# Patient Record
Sex: Male | Born: 1955 | Race: White | Hispanic: No | Smoking: Never smoker
Health system: Southern US, Community
[De-identification: ages and names within clinical notes are randomized; demographics above are authoritative.]

## PROBLEM LIST (undated history)

## (undated) DIAGNOSIS — Z9289 Personal history of other medical treatment: Secondary | ICD-10-CM

## (undated) DIAGNOSIS — K449 Diaphragmatic hernia without obstruction or gangrene: Secondary | ICD-10-CM

## (undated) DIAGNOSIS — K5730 Diverticulosis of large intestine without perforation or abscess without bleeding: Secondary | ICD-10-CM

## (undated) DIAGNOSIS — Z125 Encounter for screening for malignant neoplasm of prostate: Principal | ICD-10-CM

## (undated) DIAGNOSIS — M19042 Primary osteoarthritis, left hand: Secondary | ICD-10-CM

## (undated) DIAGNOSIS — K219 Gastro-esophageal reflux disease without esophagitis: Secondary | ICD-10-CM

## (undated) DIAGNOSIS — R0602 Shortness of breath: Secondary | ICD-10-CM

## (undated) DIAGNOSIS — I10 Essential (primary) hypertension: Secondary | ICD-10-CM

## (undated) DIAGNOSIS — R011 Cardiac murmur, unspecified: Secondary | ICD-10-CM

## (undated) DIAGNOSIS — G4733 Obstructive sleep apnea (adult) (pediatric): Secondary | ICD-10-CM

## (undated) DIAGNOSIS — C642 Malignant neoplasm of left kidney, except renal pelvis: Secondary | ICD-10-CM

## (undated) DIAGNOSIS — N281 Cyst of kidney, acquired: Secondary | ICD-10-CM

## (undated) DIAGNOSIS — M19041 Primary osteoarthritis, right hand: Secondary | ICD-10-CM

## (undated) DIAGNOSIS — D62 Acute posthemorrhagic anemia: Secondary | ICD-10-CM

## (undated) HISTORY — DX: Diaphragmatic hernia without obstruction or gangrene: K44.9

## (undated) HISTORY — PX: HERNIA REPAIR: SHX51

## (undated) HISTORY — DX: Cyst of kidney, acquired: N28.1

## (undated) HISTORY — DX: Acute posthemorrhagic anemia: D62

## (undated) HISTORY — DX: Shortness of breath: R06.02

## (undated) HISTORY — DX: Diverticulosis of large intestine without perforation or abscess without bleeding: K57.30

## (undated) HISTORY — DX: Primary osteoarthritis, left hand: M19.042

## (undated) HISTORY — DX: Encounter for screening for malignant neoplasm of prostate: Z12.5

## (undated) HISTORY — DX: Primary osteoarthritis, right hand: M19.041

## (undated) HISTORY — DX: Essential (primary) hypertension: I10

## (undated) HISTORY — DX: Obstructive sleep apnea (adult) (pediatric): G47.33

---

## 1991-05-13 HISTORY — PX: BOWEL RESECTION: SHX1257

## 2013-02-02 ENCOUNTER — Ambulatory Visit: Payer: Self-pay | Admitting: Unknown Physician Specialty

## 2013-02-04 LAB — PATHOLOGY REPORT

## 2014-03-23 DIAGNOSIS — E7800 Pure hypercholesterolemia, unspecified: Secondary | ICD-10-CM | POA: Insufficient documentation

## 2014-09-25 DIAGNOSIS — G8929 Other chronic pain: Secondary | ICD-10-CM | POA: Insufficient documentation

## 2014-09-25 DIAGNOSIS — M79646 Pain in unspecified finger(s): Secondary | ICD-10-CM

## 2015-04-03 DIAGNOSIS — M19041 Primary osteoarthritis, right hand: Secondary | ICD-10-CM

## 2015-04-03 DIAGNOSIS — M19042 Primary osteoarthritis, left hand: Secondary | ICD-10-CM

## 2015-04-03 HISTORY — DX: Primary osteoarthritis, right hand: M19.041

## 2016-03-09 DIAGNOSIS — K219 Gastro-esophageal reflux disease without esophagitis: Secondary | ICD-10-CM | POA: Insufficient documentation

## 2016-03-09 DIAGNOSIS — I10 Essential (primary) hypertension: Secondary | ICD-10-CM

## 2016-03-09 HISTORY — DX: Essential (primary) hypertension: I10

## 2016-03-11 DIAGNOSIS — D62 Acute posthemorrhagic anemia: Secondary | ICD-10-CM

## 2016-03-11 HISTORY — DX: Acute posthemorrhagic anemia: D62

## 2016-03-12 DIAGNOSIS — K449 Diaphragmatic hernia without obstruction or gangrene: Secondary | ICD-10-CM

## 2016-03-12 DIAGNOSIS — C642 Malignant neoplasm of left kidney, except renal pelvis: Secondary | ICD-10-CM

## 2016-03-12 DIAGNOSIS — K5730 Diverticulosis of large intestine without perforation or abscess without bleeding: Secondary | ICD-10-CM

## 2016-03-12 HISTORY — DX: Malignant neoplasm of left kidney, except renal pelvis: C64.2

## 2016-03-12 HISTORY — DX: Diaphragmatic hernia without obstruction or gangrene: K44.9

## 2016-03-12 HISTORY — DX: Diverticulosis of large intestine without perforation or abscess without bleeding: K57.30

## 2016-03-18 ENCOUNTER — Other Ambulatory Visit: Payer: Self-pay | Admitting: Family Medicine

## 2016-03-18 DIAGNOSIS — N281 Cyst of kidney, acquired: Secondary | ICD-10-CM

## 2016-03-21 ENCOUNTER — Ambulatory Visit: Payer: Self-pay

## 2016-03-24 DIAGNOSIS — G4733 Obstructive sleep apnea (adult) (pediatric): Secondary | ICD-10-CM

## 2016-03-24 DIAGNOSIS — R0602 Shortness of breath: Secondary | ICD-10-CM

## 2016-03-24 HISTORY — DX: Obstructive sleep apnea (adult) (pediatric): G47.33

## 2016-03-24 HISTORY — DX: Shortness of breath: R06.02

## 2016-03-26 ENCOUNTER — Ambulatory Visit
Admission: RE | Admit: 2016-03-26 | Discharge: 2016-03-26 | Disposition: A | Discharge status: Home / Self Care | Payer: BLUE CROSS/BLUE SHIELD | Source: Ambulatory Visit | Attending: Family Medicine | Admitting: Family Medicine

## 2016-03-26 DIAGNOSIS — N2889 Other specified disorders of kidney and ureter: Secondary | ICD-10-CM | POA: Insufficient documentation

## 2016-03-26 DIAGNOSIS — N281 Cyst of kidney, acquired: Secondary | ICD-10-CM | POA: Diagnosis not present

## 2016-04-08 ENCOUNTER — Ambulatory Visit: Payer: BLUE CROSS/BLUE SHIELD | Admitting: Urology

## 2016-04-08 ENCOUNTER — Encounter: Payer: Self-pay | Admitting: Urology

## 2016-04-08 VITALS — BP 130/79 | HR 73 | Ht 69.0 in | Wt 190.0 lb

## 2016-04-08 DIAGNOSIS — Z125 Encounter for screening for malignant neoplasm of prostate: Secondary | ICD-10-CM

## 2016-04-08 DIAGNOSIS — Z85528 Personal history of other malignant neoplasm of kidney: Secondary | ICD-10-CM | POA: Insufficient documentation

## 2016-04-08 DIAGNOSIS — C642 Malignant neoplasm of left kidney, except renal pelvis: Secondary | ICD-10-CM | POA: Diagnosis not present

## 2016-04-08 DIAGNOSIS — N281 Cyst of kidney, acquired: Secondary | ICD-10-CM

## 2016-04-08 HISTORY — DX: Encounter for screening for malignant neoplasm of prostate: Z12.5

## 2016-04-08 HISTORY — DX: Cyst of kidney, acquired: N28.1

## 2016-04-08 NOTE — Progress Notes (Signed)
   04/08/2016 7:01 AM   Isaac Peterson 05/14/1955 [id number]  Referring provider: Sherrin Daisy, MD Isaac Peterson, [place] [id number]  CC: New patient - left renal mass, right renal cyst, prostate screening  HPI:   1. Renal malignant neoplasm, left  - 2cm mid lateral heterogenous mass incidental by abd CT at [facility] 2017 at time of GI bleed dedicated US 03/2016 confirms solid. No dedicated axial imaging as of yet.   2. Renal cyst, acquired, right - non-complex <2cm right simple cyst w/o mass effect by [place] 03/2016.  3. Prostate cancer screening  - No FHX prostate cancer 03/2016 DRE - 35gm smoth / PSA (today / pending)  PMH sig for GI Bleed, sigmoid resection for diverticulitis (infraumbilical scar only), bilateral inguinal hernia repair as child. No CV disesae / blood thinners.   Today "Isaac Peterson" is seen as new patient for above.    PMH: No past medical history on file.  Surgical History: No past surgical history on file.  Home Medications:    Medication List    as of 04/08/2016  7:01 AM   You have not been prescribed any medications.     Allergies: Allergies not on file  Family History: No family history on file.  Social History:  has no tobacco, alcohol, and drug history on file.   Review of Systems  Gastrointestinal (upper)  : Negative for upper GI symptoms  Gastrointestinal (lower) : Negative for lower GI symptoms  Constitutional : Negative for symptoms  Skin: Negative for skin symptoms  Eyes: Negative for eye symptoms  Ear/Nose/Throat : Negative for Ear/Nose/Throat symptoms  Hematologic/Lymphatic: Negative for Hematologic/Lymphatic symptoms  Cardiovascular : Negative for cardiovascular symptoms  Respiratory : Negative for respiratory symptoms  Endocrine: Negative for endocrine symptoms  Musculoskeletal: Negative for musculoskeletal symptoms  Neurological: Negative for neurological symptoms  Psychologic: Negative for psychiatric  symptoms  Physical Exam: There were no vitals taken for this visit.  Constitutional:  Alert and oriented, No acute distress. HEENT: Isaac Peterson AT, moist mucus membranes.  Trachea midline, no masses. Cardiovascular: No clubbing, cyanosis, or edema. Respiratory: Normal respiratory effort, no increased work of breathing. GI: Abdomen is soft, nontender, nondistended, no abdominal masses GU: No CVA tenderness. Phallus sraight. No testes masses. DRE 35gm smooth.  Skin: No rashes, bruises or suspicious lesions. Lymph: No cervical or inguinal adenopathy. Neurologic: Grossly intact, no focal deficits, moving all 4 extremities. Psychiatric: Normal mood and affect.  Laboratory Data: No results found for: WBC, HGB, HCT, MCV, PLT  No results found for: CREATININE  No results found for: PSA  Urinalysis No results found for: COLORURINE, APPEARANCEUR, LABSPEC, PHURINE, GLUCOSEU, HGBUR, BILIRUBINUR, KETONESUR, PROTEINUR, UROBILINOGEN, NITRITE, LEUKOCYTESUR  Pertinent Imaging: as per HPI  Assessment & Plan:    1. Renal malignant neoplasm, left  - this is high concerning for localized stage 1 renal cell carcinoma. Rec renal MRI with and without contrast asap and RTC with myself or Dr. Erlene Peterson to discuss possible partial nephrectomy v. Surveillance.   2. Renal cyst, acquired, right - unlike solid mass above, this is low risk and has no mass effect. Observe.  3. Prostate cancer screening - DRE today normal PSA today to complete eval.  RTC with renal MRI.   No Follow-up on file.  Isaac Peterson, [facility] [address] [place], [place] [address] [phone]

## 2016-04-08 NOTE — Addendum Note (Signed)
Addended by: Tommy Rainwater on: 04/08/2016 11:33 AM   Modules accepted: Orders

## 2016-04-09 LAB — BASIC METABOLIC PANEL
BUN/Creatinine Ratio: 11 (ref 10–24)
BUN: 9 mg/dL (ref 8–27)
CO2: 24 mmol/L (ref 18–29)
CREATININE: 0.82 mg/dL (ref 0.76–1.27)
Calcium: 9.9 mg/dL (ref 8.6–10.2)
Chloride: 99 mmol/L (ref 96–106)
GFR, EST AFRICAN AMERICAN: 111 mL/min/{1.73_m2} (ref >59)
GFR, EST NON AFRICAN AMERICAN: 96 mL/min/{1.73_m2} (ref >59)
Glucose: 99 mg/dL (ref 65–99)
Potassium: 4.4 mmol/L (ref 3.5–5.2)
SODIUM: 139 mmol/L (ref 134–144)

## 2016-04-09 LAB — PSA: Prostate Specific Ag, Serum: 2.5 ng/mL (ref 0.0–4.0)

## 2016-05-02 ENCOUNTER — Ambulatory Visit
Admission: RE | Admit: 2016-05-02 | Discharge: 2016-05-02 | Disposition: A | Discharge status: Home / Self Care | Payer: BLUE CROSS/BLUE SHIELD | Source: Ambulatory Visit | Attending: Urology | Admitting: Urology

## 2016-05-02 DIAGNOSIS — C642 Malignant neoplasm of left kidney, except renal pelvis: Secondary | ICD-10-CM | POA: Insufficient documentation

## 2016-05-02 DIAGNOSIS — N281 Cyst of kidney, acquired: Secondary | ICD-10-CM | POA: Diagnosis present

## 2016-05-02 DIAGNOSIS — M899 Disorder of bone, unspecified: Secondary | ICD-10-CM | POA: Insufficient documentation

## 2016-05-02 DIAGNOSIS — K5710 Diverticulosis of small intestine without perforation or abscess without bleeding: Secondary | ICD-10-CM | POA: Insufficient documentation

## 2016-05-02 DIAGNOSIS — K449 Diaphragmatic hernia without obstruction or gangrene: Secondary | ICD-10-CM | POA: Insufficient documentation

## 2016-05-02 DIAGNOSIS — N2889 Other specified disorders of kidney and ureter: Secondary | ICD-10-CM | POA: Diagnosis present

## 2016-05-02 MED ORDER — GADOBENATE DIMEGLUMINE 529 MG/ML IV SOLN
18.0000 mL | Freq: Once | INTRAVENOUS | Status: AC | PRN
  Administered 2016-05-02: 18 mL via INTRAVENOUS

## 2016-05-07 ENCOUNTER — Telehealth: Payer: Self-pay

## 2016-05-07 NOTE — Telephone Encounter (Signed)
Isaac Frock, MD  Isaac Box, LPN        Please mail MRI report with message below:   Isaac Peterson,   Renal MRI confirms likely cystic renal tumor. The radiologist also notes small area on spine that is likely benign bone growth known as a hemangioma.   It appears that you are now seeing a Urologist at [facility] and have opted for surveillance. That is reasonable.   Both of these areas can be surveilled on future imaging through them if you prefer.   Isaac Colla MD      Spoke with pt in reference to MRI results and surveillance. Pt voiced understanding.

## 2016-05-19 ENCOUNTER — Ambulatory Visit: Payer: BLUE CROSS/BLUE SHIELD | Admitting: Urology

## 2016-05-19 ENCOUNTER — Encounter: Payer: Self-pay | Admitting: Urology

## 2016-05-19 VITALS — BP 102/68 | HR 76 | Ht 69.0 in | Wt 194.6 lb

## 2016-05-19 DIAGNOSIS — C642 Malignant neoplasm of left kidney, except renal pelvis: Secondary | ICD-10-CM

## 2016-05-19 DIAGNOSIS — N281 Cyst of kidney, acquired: Secondary | ICD-10-CM

## 2016-05-19 DIAGNOSIS — Z125 Encounter for screening for malignant neoplasm of prostate: Secondary | ICD-10-CM

## 2016-05-19 NOTE — Progress Notes (Signed)
05/19/2016 6:39 AM   Isaac Peterson 1956-01-08 [id number]  Referring provider: Sherrin Daisy, MD [place], [place] [id number]  CC: Follow Up - left renal mass, right renal cyst, prostate screening  HPI:   1. Renal malignant neoplasm, left  - 2cm mid lateral heterogenous mass incidental by abd CT at [facility] 2017 at time of GI bleed. Dedicated MRI shows 50% exophytic posterior mid cystic mass 2.4cm with some enhancement c/w likely small clinically localized cystic renal cell. 1 artery / 1 vein renovascular anatomy. Open retroperitoneal window to kidney (no retrorenal colon).   2. Renal cyst, acquired, right - non-complex <2cm right simple cyst w/o mass effect by [place] 03/2016. Confirmed non-comples by MRI 05/2016.   3. Prostate cancer screening  - No FHX prostate cancer 03/2016 DRE - 35gm smoth / PSA 2.5  PMH sig for GI Bleed, sigmoid resection for diverticulitis (infraumbilical scar only), bilateral inguinal hernia repair as child. No CV disesae / blood thinners.   Today "Isaac Peterson" is seen in f/u above after MRI which confirms left small renal mass concerning for localized kidney cancer. Small T12 hemagioma also noted (seen / confirmed on prior referring CT)   PMH: Past Medical History:  Diagnosis Date  . Acute blood loss anemia 03/11/2016  . Arthritis of both hands 04/03/2015  . Diverticulosis of colon 03/12/2016  . Hypertension 03/09/2016  . Large hiatal hernia 03/12/2016  . OSA (obstructive sleep apnea) 03/24/2016  . Prostate cancer screening 04/08/2016  . Renal cyst, acquired, right 04/08/2016  . SOBOE (shortness of breath on exertion) 03/24/2016    Surgical History: Past Surgical History:  Procedure Laterality Date  . BOWEL RESECTION  1993  . [place]   x3    Home Medications:  Allergies as of 05/19/2016   No Known Allergies     Medication List       Accurate as of 05/19/16  6:39 AM. Always use your most recent med list.            ferrous sulfate 325 (65 FE) MG EC tablet Take 325 mg by mouth 3 (three) times daily with meals.   lisinopril-hydrochlorothiazide 20-25 MG tablet Commonly known as:  PRINZIDE,ZESTORETIC TAKE ONE TABLET BY MOUTH ONCE DAILY   MULTI-VITAMINS Tabs Take by mouth.   pravastatin 40 MG tablet Commonly known as:  PRAVACHOL TAKE ONE TABLET BY MOUTH ONCE DAILY   RA VITAMIN B-12 TR 1000 MCG Tbcr Generic drug:  Cyanocobalamin Take by mouth.       Allergies: No Known Allergies  Family History: Family History  Problem Relation Age of Onset  . Prostate cancer Neg Hx   . Kidney cancer Neg Hx     Social History:  reports that he has never smoked. He has never used smokeless tobacco. He reports that he does not drink alcohol or use drugs.   Review of Systems  Gastrointestinal (upper)  : Negative for upper GI symptoms  Gastrointestinal (lower) : Negative for lower GI symptoms  Constitutional : Negative for symptoms  Skin: Negative for skin symptoms  Eyes: Negative for eye symptoms  Ear/Nose/Throat : Negative for Ear/Nose/Throat symptoms  Hematologic/Lymphatic: Negative for Hematologic/Lymphatic symptoms  Cardiovascular : Negative for cardiovascular symptoms  Respiratory : Negative for respiratory symptoms  Endocrine: Negative for endocrine symptoms  Musculoskeletal: Negative for musculoskeletal symptoms  Neurological: Negative for neurological symptoms  Psychologic: Negative for psychiatric symptoms  Physical Exam: There were no vitals taken for this visit.  Constitutional:  Alert and oriented, No acute distress. HEENT: [place] AT, moist mucus membranes.  Trachea midline, no masses. Cardiovascular: No clubbing, cyanosis, or edema. Respiratory: Normal respiratory effort, no increased work of breathing. GI: Abdomen is soft, nontender, nondistended, no abdominal masses GU: No CVA tenderness.  Skin: No rashes, bruises or suspicious lesions. Lymph: No cervical or  inguinal adenopathy. Neurologic: Grossly intact, no focal deficits, moving all 4 extremities. Psychiatric: Normal mood and affect.  Laboratory Data: No results found for: WBC, HGB, HCT, MCV, PLT  Lab Results  Component Value Date   CREATININE 0.82 04/08/2016    No results found for: PSA  Urinalysis No results found for: COLORURINE, APPEARANCEUR, LABSPEC, PHURINE, GLUCOSEU, HGBUR, BILIRUBINUR, KETONESUR, PROTEINUR, UROBILINOGEN, NITRITE, LEUKOCYTESUR  Pertinent Imaging: as per HPI  Assessment & Plan:    1. Renal malignant neoplasm, left  - likely small cystic renal cell. Natural history (some indolent, some progressive) and management options including surveillance (consider therapy if clearly progresive), ablation, partial nephrectomy (trans or retroperitoneal) discussed in order of increasing agressiveness but also short term treatment related risk.  He opts for surveillance at this point with very low threshold to partial nephrectomy, I agree completely.   2. Renal cyst, acquired, right - unlike solid mass above, this is low risk and has no mass effect. Observe.  3. Prostate cancer screening - up to date on screening this year, continue until age 34 or so as average risk.   RTC 47mos with BMP, CT.   Alexis Frock, [facility] [address], [place], [place] [address] [phone]

## 2016-05-22 ENCOUNTER — Other Ambulatory Visit: Payer: Self-pay | Admitting: Urology

## 2016-06-26 NOTE — Patient Instructions (Signed)
Isaac Peterson  06/26/2016   Your procedure is scheduled on: 07/02/2016    Report to [facility]  Entrance take [facility]  elevators to 3rd floor to  [place] at    1100 AM.  Call this number if you have problems the morning of surgery [phone]   Remember: ONLY 1 PERSON MAY GO WITH YOU TO SHORT STAY TO GET  READY MORNING OF [name]  Do not eat food or drink liquids :After Midnight.             Clear liquid diet the day before surgery.               Bottle of Magnesium citrate the nite before surgery.      Take these medicines the morning of surgery with A SIP OF WATER: none                                 You may not have any metal on your body including hair pins and              piercings  Do not wear jewelry,  lotions, powders or perfumes, deodorant                     Men may shave face and neck.   Do not bring valuables to the hospital. [place].  Contacts, dentures or bridgework may not be worn into surgery.  Leave suitcase in the car. After surgery it may be brought to your room.                     Please read over the following fact sheets you were given: _____________________________________________________________________                CLEAR LIQUID DIET   Foods Allowed                                                                     Foods Excluded  Coffee and tea, regular and decaf                             liquids that you cannot  Plain Jell-O in any flavor                                             see through such as: Fruit ices (not with fruit pulp)                                     milk, soups, orange juice  Iced Popsicles  All solid food Carbonated beverages, regular and diet                                    Cranberry, grape and apple juices Sports drinks like Gatorade Lightly seasoned clear broth or consume(fat  free) Sugar, honey syrup  Sample Menu Breakfast                                Lunch                                     Supper Cranberry juice                    Beef broth                            Chicken broth Jell-O                                     Grape juice                           Apple juice Coffee or tea                        Jell-O                                      Popsicle                                                Coffee or tea                        Coffee or tea  _____________________________________________________________________  [facility] - Preparing for Surgery Before surgery, you can play an important role.  Because skin is not sterile, your skin needs to be as free of germs as possible.  You can reduce the number of germs on your skin by washing with CHG (chlorahexidine gluconate) soap before surgery.  CHG is an antiseptic cleaner which kills germs and bonds with the skin to continue killing germs even after washing. Please DO NOT use if you have an allergy to CHG or antibacterial soaps.  If your skin becomes reddened/irritated stop using the CHG and inform your nurse when you arrive at Short Stay. Do not shave (including legs and underarms) for at least 48 hours prior to the first CHG shower.  You may shave your face/neck. Please follow these instructions carefully:  1.  Shower with CHG Soap the night before surgery and the  morning of Surgery.  2.  If you choose to wash your hair, wash your hair first as usual with your  normal  shampoo.  3.  After you shampoo, rinse your hair and body thoroughly to remove the  shampoo.  4.  Use CHG as you would any other liquid soap.  You can apply chg directly  to the skin and wash                       Gently with a scrungie or clean washcloth.  5.  Apply the CHG Soap to your body ONLY FROM THE NECK DOWN.   Do not use on face/ open                           Wound or open sores. Avoid contact  with eyes, ears mouth and genitals (private parts).                       Wash face,  Genitals (private parts) with your normal soap.             6.  Wash thoroughly, paying special attention to the area where your surgery  will be performed.  7.  Thoroughly rinse your body with warm water from the neck down.  8.  DO NOT shower/wash with your normal soap after using and rinsing off  the CHG Soap.                9.  Pat yourself dry with a clean towel.            10.  Wear clean pajamas.            11.  Place clean sheets on your bed the night of your first shower and do not  sleep with pets. Day of Surgery : Do not apply any lotions/deodorants the morning of surgery.  Please wear clean clothes to the hospital/surgery center.  FAILURE TO FOLLOW THESE INSTRUCTIONS MAY RESULT IN THE CANCELLATION OF YOUR SURGERY PATIENT SIGNATURE_________________________________  NURSE SIGNATURE__________________________________  ________________________________________________________________________  WHAT IS A BLOOD TRANSFUSION? Blood Transfusion Information  A transfusion is the replacement of blood or some of its parts. Blood is made up of multiple cells which provide different functions.  Red blood cells carry oxygen and are used for blood loss replacement.  White blood cells fight against infection.  Platelets control bleeding.  Plasma helps clot blood.  Other blood products are available for specialized needs, such as hemophilia or other clotting disorders. BEFORE THE TRANSFUSION  Who gives blood for transfusions?   Healthy volunteers who are fully evaluated to make sure their blood is safe. This is blood bank blood. Transfusion therapy is the safest it has ever been in the practice of medicine. Before blood is taken from a donor, a complete history is taken to make sure that person has no history of diseases nor engages in risky social behavior (examples are intravenous drug use or sexual  activity with multiple partners). The donor's travel history is screened to minimize risk of transmitting infections, such as malaria. The donated blood is tested for signs of infectious diseases, such as HIV and hepatitis. The blood is then tested to be sure it is compatible with you in order to minimize the chance of a transfusion reaction. If you or a relative donates blood, this is often done in anticipation of surgery and is not appropriate for emergency situations. It takes many days to process the donated blood. RISKS AND COMPLICATIONS Although transfusion therapy is very safe and saves many lives, the main dangers of transfusion include:   Getting an infectious disease.  Developing a transfusion reaction.  This is an allergic reaction to something in the blood you were given. Every precaution is taken to prevent this. The decision to have a blood transfusion has been considered carefully by your caregiver before blood is given. Blood is not given unless the benefits outweigh the risks. AFTER THE TRANSFUSION  Right after receiving a blood transfusion, you will usually feel much better and more energetic. This is especially true if your red blood cells have gotten low (anemic). The transfusion raises the level of the red blood cells which carry oxygen, and this usually causes an energy increase.  The nurse administering the transfusion will monitor you carefully for complications. HOME CARE INSTRUCTIONS  No special instructions are needed after a transfusion. You may find your energy is better. Speak with your caregiver about any limitations on activity for underlying diseases you may have. SEEK MEDICAL CARE IF:   Your condition is not improving after your transfusion.  You develop redness or irritation at the intravenous (IV) site. SEEK IMMEDIATE MEDICAL CARE IF:  Any of the following symptoms occur over the next 12 hours:  Shaking chills.  You have a temperature by mouth above 102 F  (38.9 C), not controlled by medicine.  Chest, back, or muscle pain.  People around you feel you are not acting correctly or are confused.  Shortness of breath or difficulty breathing.  Dizziness and fainting.  You get a rash or develop hives.  You have a decrease in urine output.  Your urine turns a dark color or changes to pink, red, or brown. Any of the following symptoms occur over the next 10 days:  You have a temperature by mouth above 102 F (38.9 C), not controlled by medicine.  Shortness of breath.  Weakness after normal activity.  The white part of the eye turns yellow (jaundice).  You have a decrease in the amount of urine or are urinating less often.  Your urine turns a dark color or changes to pink, red, or brown. Document Released: 04/25/2000 Document Revised: 07/21/2011 Document Reviewed: 12/13/2007 [facility] Patient Information 2014 [place], [place].  _______________________________________________________________________

## 2016-06-27 ENCOUNTER — Encounter (HOSPITAL_COMMUNITY): Admission: RE | Admit: 2016-06-27 | Payer: BLUE CROSS/BLUE SHIELD | Source: Ambulatory Visit

## 2016-06-30 ENCOUNTER — Encounter (HOSPITAL_COMMUNITY): Payer: Self-pay

## 2016-06-30 ENCOUNTER — Encounter (HOSPITAL_COMMUNITY)
Admission: RE | Admit: 2016-06-30 | Discharge: 2016-06-30 | Disposition: A | Discharge status: Home / Self Care | Payer: BLUE CROSS/BLUE SHIELD | Source: Ambulatory Visit | Attending: Urology | Admitting: Urology

## 2016-06-30 DIAGNOSIS — N2889 Other specified disorders of kidney and ureter: Secondary | ICD-10-CM

## 2016-06-30 DIAGNOSIS — Z01810 Encounter for preprocedural cardiovascular examination: Secondary | ICD-10-CM

## 2016-06-30 DIAGNOSIS — Z01812 Encounter for preprocedural laboratory examination: Secondary | ICD-10-CM

## 2016-06-30 HISTORY — DX: Cardiac murmur, unspecified: R01.1

## 2016-06-30 HISTORY — DX: Personal history of other medical treatment: Z92.89

## 2016-06-30 HISTORY — DX: Gastro-esophageal reflux disease without esophagitis: K21.9

## 2016-06-30 LAB — BASIC METABOLIC PANEL
Anion gap: 8 (ref 5–15)
BUN: 14 mg/dL (ref 6–20)
CALCIUM: 9.2 mg/dL (ref 8.9–10.3)
CHLORIDE: 108 mmol/L (ref 101–111)
CO2: 25 mmol/L (ref 22–32)
CREATININE: 0.95 mg/dL (ref 0.61–1.24)
GFR calc non Af Amer: >60 mL/min (ref >60)
Glucose, Bld: 109 mg/dL — ABNORMAL HIGH (ref 65–99)
Potassium: 3.9 mmol/L (ref 3.5–5.1)
SODIUM: 141 mmol/L (ref 135–145)

## 2016-06-30 LAB — CBC
HCT: 41.4 % (ref 39.0–52.0)
Hemoglobin: 13.8 g/dL (ref 13.0–17.0)
MCH: 28.8 pg (ref 26.0–34.0)
MCHC: 33.3 g/dL (ref 30.0–36.0)
MCV: 86.3 fL (ref 78.0–100.0)
Platelets: 255 10*3/uL (ref 150–400)
RBC: 4.8 MIL/uL (ref 4.22–5.81)
RDW: 12.7 % (ref 11.5–15.5)
WBC: 5.2 10*3/uL (ref 4.0–10.5)

## 2016-06-30 LAB — ABO/RH: ABO/RH(D): O POS

## 2016-06-30 NOTE — Progress Notes (Signed)
EKg-03/24/16-epic  LOV-03/24/16- NP-epic  04/01/16- Stress Test on chart

## 2016-07-02 ENCOUNTER — Inpatient Hospital Stay (HOSPITAL_COMMUNITY): Payer: BLUE CROSS/BLUE SHIELD | Admitting: Registered Nurse

## 2016-07-02 ENCOUNTER — Encounter (HOSPITAL_COMMUNITY): Payer: Self-pay | Admitting: Anesthesiology

## 2016-07-02 ENCOUNTER — Encounter (HOSPITAL_COMMUNITY)
Admission: RE | Disposition: A | Discharge status: Home / Self Care | Payer: Self-pay | Source: Ambulatory Visit | Attending: Urology

## 2016-07-02 ENCOUNTER — Inpatient Hospital Stay (HOSPITAL_COMMUNITY)
Admission: RE | Admit: 2016-07-02 | Discharge: 2016-07-03 | DRG: 658 | Disposition: A | Discharge status: Home / Self Care | Payer: BLUE CROSS/BLUE SHIELD | Source: Ambulatory Visit | Attending: Urology | Admitting: Urology

## 2016-07-02 DIAGNOSIS — K219 Gastro-esophageal reflux disease without esophagitis: Secondary | ICD-10-CM | POA: Diagnosis present

## 2016-07-02 DIAGNOSIS — C642 Malignant neoplasm of left kidney, except renal pelvis: Principal | ICD-10-CM | POA: Diagnosis present

## 2016-07-02 DIAGNOSIS — G4733 Obstructive sleep apnea (adult) (pediatric): Secondary | ICD-10-CM | POA: Diagnosis present

## 2016-07-02 DIAGNOSIS — Z79899 Other long term (current) drug therapy: Secondary | ICD-10-CM

## 2016-07-02 DIAGNOSIS — N2889 Other specified disorders of kidney and ureter: Secondary | ICD-10-CM | POA: Diagnosis present

## 2016-07-02 DIAGNOSIS — I10 Essential (primary) hypertension: Secondary | ICD-10-CM | POA: Diagnosis present

## 2016-07-02 HISTORY — PX: ROBOTIC ASSITED PARTIAL NEPHRECTOMY: SHX6087

## 2016-07-02 LAB — HEMOGLOBIN AND HEMATOCRIT, BLOOD
HCT: 41.6 % (ref 39.0–52.0)
Hemoglobin: 14.2 g/dL (ref 13.0–17.0)

## 2016-07-02 LAB — TYPE AND SCREEN
ABO/RH(D): O POS
Antibody Screen: NEGATIVE

## 2016-07-02 SURGERY — NEPHRECTOMY, PARTIAL, ROBOT-ASSISTED
Anesthesia: General | Laterality: Left

## 2016-07-02 MED ORDER — ROCURONIUM BROMIDE 10 MG/ML (PF) SYRINGE
PREFILLED_SYRINGE | INTRAVENOUS | Status: DC | PRN
  Administered 2016-07-02 (×2): 20 mg via INTRAVENOUS
  Administered 2016-07-02: 50 mg via INTRAVENOUS

## 2016-07-02 MED ORDER — HYDROMORPHONE HCL 1 MG/ML IJ SOLN
0.5000 mg | INTRAMUSCULAR | Status: DC | PRN
  Administered 2016-07-02: 1 mg via INTRAVENOUS
  Filled 2016-07-02: qty 1

## 2016-07-02 MED ORDER — SUGAMMADEX SODIUM 200 MG/2ML IV SOLN
INTRAVENOUS | Status: DC | PRN
  Administered 2016-07-02: 400 mg via INTRAVENOUS

## 2016-07-02 MED ORDER — SODIUM CHLORIDE 0.9 % IJ SOLN
INTRAMUSCULAR | Status: AC
  Filled 2016-07-02: qty 50

## 2016-07-02 MED ORDER — LACTATED RINGERS IR SOLN
Status: DC | PRN
  Administered 2016-07-02: 1000 mL

## 2016-07-02 MED ORDER — HYDROCODONE-ACETAMINOPHEN 5-325 MG PO TABS: 1.0000 | ORAL_TABLET | Freq: Four times a day (QID) | ORAL | 0 refills | Status: DC | PRN

## 2016-07-02 MED ORDER — LIDOCAINE 2% (20 MG/ML) 5 ML SYRINGE
INTRAMUSCULAR | Status: DC | PRN
  Administered 2016-07-02: 100 mg via INTRAVENOUS

## 2016-07-02 MED ORDER — SODIUM CHLORIDE 0.9 % IJ SOLN
INTRAMUSCULAR | Status: DC | PRN
  Administered 2016-07-02: 20 mL

## 2016-07-02 MED ORDER — DIPHENHYDRAMINE HCL 12.5 MG/5ML PO ELIX: 12.5000 mg | ORAL_SOLUTION | Freq: Four times a day (QID) | ORAL | Status: DC | PRN

## 2016-07-02 MED ORDER — MIDAZOLAM HCL 5 MG/5ML IJ SOLN
INTRAMUSCULAR | Status: DC | PRN
  Administered 2016-07-02: 2 mg via INTRAVENOUS

## 2016-07-02 MED ORDER — DEXTROSE-NACL 5-0.45 % IV SOLN
INTRAVENOUS | Status: DC
  Administered 2016-07-02 – 2016-07-03 (×2): via INTRAVENOUS

## 2016-07-02 MED ORDER — LISINOPRIL-HYDROCHLOROTHIAZIDE 20-25 MG PO TABS: 1.0000 | ORAL_TABLET | Freq: Every day | ORAL | Status: DC

## 2016-07-02 MED ORDER — CEFAZOLIN SODIUM-DEXTROSE 2-4 GM/100ML-% IV SOLN
2.0000 g | INTRAVENOUS | Status: AC
  Administered 2016-07-02: 2 g via INTRAVENOUS

## 2016-07-02 MED ORDER — MIDAZOLAM HCL 2 MG/2ML IJ SOLN
INTRAMUSCULAR | Status: AC
  Filled 2016-07-02: qty 2

## 2016-07-02 MED ORDER — EPHEDRINE 5 MG/ML INJ
INTRAVENOUS | Status: AC
  Filled 2016-07-02: qty 10

## 2016-07-02 MED ORDER — FENTANYL CITRATE (PF) 100 MCG/2ML IJ SOLN
INTRAMUSCULAR | Status: DC | PRN
  Administered 2016-07-02: 150 ug via INTRAVENOUS
  Administered 2016-07-02 (×2): 50 ug via INTRAVENOUS

## 2016-07-02 MED ORDER — OXYCODONE HCL 5 MG PO TABS
5.0000 mg | ORAL_TABLET | ORAL | Status: DC | PRN
  Administered 2016-07-02 – 2016-07-03 (×2): 5 mg via ORAL
  Filled 2016-07-02 (×2): qty 1

## 2016-07-02 MED ORDER — LISINOPRIL 20 MG PO TABS
20.0000 mg | ORAL_TABLET | Freq: Every day | ORAL | Status: DC
  Administered 2016-07-02: 20 mg via ORAL
  Filled 2016-07-02: qty 1

## 2016-07-02 MED ORDER — PROPOFOL 10 MG/ML IV BOLUS
INTRAVENOUS | Status: DC | PRN
  Administered 2016-07-02: 200 mg via INTRAVENOUS

## 2016-07-02 MED ORDER — SUGAMMADEX SODIUM 500 MG/5ML IV SOLN
INTRAVENOUS | Status: AC
  Filled 2016-07-02: qty 5

## 2016-07-02 MED ORDER — LIDOCAINE 2% (20 MG/ML) 5 ML SYRINGE
INTRAMUSCULAR | Status: AC
  Filled 2016-07-02: qty 5

## 2016-07-02 MED ORDER — ONDANSETRON HCL 4 MG/2ML IJ SOLN: 4.0000 mg | INTRAMUSCULAR | Status: DC | PRN

## 2016-07-02 MED ORDER — ROCURONIUM BROMIDE 50 MG/5ML IV SOSY
PREFILLED_SYRINGE | INTRAVENOUS | Status: AC
  Filled 2016-07-02: qty 10

## 2016-07-02 MED ORDER — PROMETHAZINE HCL 25 MG/ML IJ SOLN: 6.2500 mg | INTRAMUSCULAR | Status: DC | PRN

## 2016-07-02 MED ORDER — ONDANSETRON HCL 4 MG/2ML IJ SOLN
INTRAMUSCULAR | Status: DC | PRN
  Administered 2016-07-02: 4 mg via INTRAVENOUS

## 2016-07-02 MED ORDER — FENTANYL CITRATE (PF) 250 MCG/5ML IJ SOLN
INTRAMUSCULAR | Status: AC
  Filled 2016-07-02: qty 5

## 2016-07-02 MED ORDER — HYDROMORPHONE HCL 1 MG/ML IJ SOLN: 0.2500 mg | INTRAMUSCULAR | Status: DC | PRN

## 2016-07-02 MED ORDER — ACETAMINOPHEN 500 MG PO TABS
1000.0000 mg | ORAL_TABLET | Freq: Four times a day (QID) | ORAL | Status: AC
  Administered 2016-07-02 – 2016-07-03 (×4): 1000 mg via ORAL
  Filled 2016-07-02 (×4): qty 2

## 2016-07-02 MED ORDER — DEXAMETHASONE SODIUM PHOSPHATE 10 MG/ML IJ SOLN
INTRAMUSCULAR | Status: DC | PRN
  Administered 2016-07-02: 10 mg via INTRAVENOUS

## 2016-07-02 MED ORDER — PROPOFOL 10 MG/ML IV BOLUS
INTRAVENOUS | Status: AC
  Filled 2016-07-02: qty 20

## 2016-07-02 MED ORDER — DIPHENHYDRAMINE HCL 50 MG/ML IJ SOLN: 12.5000 mg | Freq: Four times a day (QID) | INTRAMUSCULAR | Status: DC | PRN

## 2016-07-02 MED ORDER — BUPIVACAINE LIPOSOME 1.3 % IJ SUSP
INTRAMUSCULAR | Status: AC
  Filled 2016-07-02: qty 20

## 2016-07-02 MED ORDER — DEXAMETHASONE SODIUM PHOSPHATE 10 MG/ML IJ SOLN
INTRAMUSCULAR | Status: AC
  Filled 2016-07-02: qty 1

## 2016-07-02 MED ORDER — BUPIVACAINE LIPOSOME 1.3 % IJ SUSP
INTRAMUSCULAR | Status: DC | PRN
  Administered 2016-07-02: 20 mL

## 2016-07-02 MED ORDER — HYDROCHLOROTHIAZIDE 25 MG PO TABS
25.0000 mg | ORAL_TABLET | Freq: Every day | ORAL | Status: DC
  Administered 2016-07-02: 25 mg via ORAL
  Filled 2016-07-02: qty 1

## 2016-07-02 MED ORDER — MEPERIDINE HCL 50 MG/ML IJ SOLN: 6.2500 mg | INTRAMUSCULAR | Status: DC | PRN

## 2016-07-02 MED ORDER — PRAVASTATIN SODIUM 40 MG PO TABS
40.0000 mg | ORAL_TABLET | Freq: Every day | ORAL | Status: DC
  Administered 2016-07-02: 40 mg via ORAL
  Filled 2016-07-02 (×2): qty 1

## 2016-07-02 MED ORDER — LACTATED RINGERS IV SOLN
INTRAVENOUS | Status: DC | PRN
  Administered 2016-07-02 (×3): via INTRAVENOUS

## 2016-07-02 MED ORDER — EPHEDRINE SULFATE-NACL 50-0.9 MG/10ML-% IV SOSY
PREFILLED_SYRINGE | INTRAVENOUS | Status: DC | PRN
  Administered 2016-07-02 (×2): 25 mg via INTRAVENOUS

## 2016-07-02 MED ORDER — ONDANSETRON HCL 4 MG/2ML IJ SOLN
INTRAMUSCULAR | Status: AC
  Filled 2016-07-02: qty 2

## 2016-07-02 MED ORDER — CEFAZOLIN SODIUM-DEXTROSE 2-4 GM/100ML-% IV SOLN
INTRAVENOUS | Status: AC
  Filled 2016-07-02: qty 100

## 2016-07-02 MED ORDER — STERILE WATER FOR IRRIGATION IR SOLN
Status: DC | PRN
  Administered 2016-07-02: 1000 mL

## 2016-07-02 SURGICAL SUPPLY — 72 items
APPLICATOR SURGIFLO ENDO (HEMOSTASIS) ×3 IMPLANT
CATH FOLEY 2WAY SLVR  5CC 14FR (CATHETERS) ×2
CATH FOLEY 2WAY SLVR 5CC 14FR (CATHETERS) ×1 IMPLANT
CHLORAPREP W/TINT 26ML (MISCELLANEOUS) ×3 IMPLANT
CLIP LIGATING HEM O LOK PURPLE (MISCELLANEOUS) ×3 IMPLANT
CLIP LIGATING HEMO LOK XL GOLD (MISCELLANEOUS) IMPLANT
CLIP LIGATING HEMO O LOK GREEN (MISCELLANEOUS) ×9 IMPLANT
CLIP SUT LAPRA TY ABSORB (SUTURE) ×9 IMPLANT
COVER SURGICAL LIGHT HANDLE (MISCELLANEOUS) ×3 IMPLANT
COVER TIP SHEARS 8 DVNC (MISCELLANEOUS) ×1 IMPLANT
COVER TIP SHEARS 8MM DA VINCI (MISCELLANEOUS) ×2
DECANTER SPIKE VIAL GLASS SM (MISCELLANEOUS) ×3 IMPLANT
DERMABOND ADVANCED (GAUZE/BANDAGES/DRESSINGS) ×2
DERMABOND ADVANCED .7 DNX12 (GAUZE/BANDAGES/DRESSINGS) ×1 IMPLANT
DRAIN CHANNEL 15F RND FF 3/16 (WOUND CARE) ×3 IMPLANT
DRAPE ARM DVNC X/XI (DISPOSABLE) ×4 IMPLANT
DRAPE COLUMN DVNC XI (DISPOSABLE) ×1 IMPLANT
DRAPE DA VINCI XI ARM (DISPOSABLE) ×8
DRAPE DA VINCI XI COLUMN (DISPOSABLE) ×2
DRAPE INCISE IOBAN 66X45 STRL (DRAPES) ×3 IMPLANT
DRAPE LAPAROSCOPIC ABDOMINAL (DRAPES) ×3 IMPLANT
DRAPE SHEET LG 3/4 BI-LAMINATE (DRAPES) ×3 IMPLANT
ELECT PENCIL ROCKER SW 15FT (MISCELLANEOUS) ×3 IMPLANT
ELECT REM PT RETURN 9FT ADLT (ELECTROSURGICAL) ×3
ELECTRODE REM PT RTRN 9FT ADLT (ELECTROSURGICAL) ×1 IMPLANT
EVACUATOR SILICONE 100CC (DRAIN) ×3 IMPLANT
GLOVE BIO SURGEON STRL SZ 6.5 (GLOVE) ×4 IMPLANT
GLOVE BIO SURGEONS STRL SZ 6.5 (GLOVE) ×2
GLOVE BIOGEL M STRL SZ7.5 (GLOVE) ×12 IMPLANT
GOWN STRL REUS W/TWL LRG LVL3 (GOWN DISPOSABLE) ×18 IMPLANT
HEMOSTAT SURGICEL 4X8 (HEMOSTASIS) ×3 IMPLANT
IRRIG SUCT STRYKERFLOW 2 WTIP (MISCELLANEOUS) ×3
IRRIGATION SUCT STRKRFLW 2 WTP (MISCELLANEOUS) ×1 IMPLANT
KIT BASIN OR (CUSTOM PROCEDURE TRAY) ×3 IMPLANT
LOOP VESSEL MAXI BLUE (MISCELLANEOUS) ×3 IMPLANT
MARKER SKIN DUAL TIP RULER LAB (MISCELLANEOUS) ×3 IMPLANT
NEEDLE INSUFFLATION 14GA 120MM (NEEDLE) ×3 IMPLANT
NS IRRIG 1000ML POUR BTL (IV SOLUTION) IMPLANT
PORT ACCESS TROCAR AIRSEAL 12 (TROCAR) ×1 IMPLANT
PORT ACCESS TROCAR AIRSEAL 5M (TROCAR) ×2
POSITIONER SURGICAL ARM (MISCELLANEOUS) ×6 IMPLANT
POUCH SPECIMEN RETRIEVAL 10MM (ENDOMECHANICALS) ×3 IMPLANT
RELOAD STAPLER WHITE 60MM (STAPLE) IMPLANT
RETRACT II ENDO 10MM 32CML (ENDOMECHANICALS) ×3
RETRACTOR II ENDO 10MM 32CML (ENDOMECHANICALS) ×1 IMPLANT
SEAL CANN UNIV 5-8 DVNC XI (MISCELLANEOUS) ×4 IMPLANT
SEAL XI 5MM-8MM UNIVERSAL (MISCELLANEOUS) ×8
SET TRI-LUMEN FLTR TB AIRSEAL (TUBING) ×3 IMPLANT
SOLUTION ELECTROLUBE (MISCELLANEOUS) ×3 IMPLANT
SPONGE LAP 4X18 X RAY DECT (DISPOSABLE) ×3 IMPLANT
STAPLE ECHEON FLEX 60 POW ENDO (STAPLE) IMPLANT
STAPLER RELOAD WHITE 60MM (STAPLE)
SURGIFLO W/THROMBIN 8M KIT (HEMOSTASIS) ×3 IMPLANT
SUT ETHILON 3 0 PS 1 (SUTURE) ×3 IMPLANT
SUT MNCRL AB 4-0 PS2 18 (SUTURE) ×6 IMPLANT
SUT PDS AB 1 CT1 27 (SUTURE) ×6 IMPLANT
SUT V-LOC BARB 180 2/0GR6 GS22 (SUTURE)
SUT VIC AB 0 CT1 27 (SUTURE) ×8
SUT VIC AB 0 CT1 27XBRD ANTBC (SUTURE) ×4 IMPLANT
SUT VIC AB 2-0 SH 27 (SUTURE) ×4
SUT VIC AB 2-0 SH 27X BRD (SUTURE) ×2 IMPLANT
SUT VLOC BARB 180 ABS3/0GR12 (SUTURE) ×3
SUTURE V-LC BRB 180 2/0GR6GS22 (SUTURE) IMPLANT
SUTURE VLOC BRB 180 ABS3/0GR12 (SUTURE) ×1 IMPLANT
TAPE STRIPS DRAPE STRL (GAUZE/BANDAGES/DRESSINGS) IMPLANT
TOWEL OR 17X26 10 PK STRL BLUE (TOWEL DISPOSABLE) ×3 IMPLANT
TOWEL OR NON WOVEN STRL DISP B (DISPOSABLE) ×3 IMPLANT
TRAY FOLEY W/METER SILVER 16FR (SET/KITS/TRAYS/PACK) ×3 IMPLANT
TRAY LAPAROSCOPIC (CUSTOM PROCEDURE TRAY) ×3 IMPLANT
TROCAR BLADELESS OPT 5 100 (ENDOMECHANICALS) IMPLANT
TROCAR XCEL 12X100 BLDLESS (ENDOMECHANICALS) ×3 IMPLANT
WATER STERILE IRR 1500ML POUR (IV SOLUTION) IMPLANT

## 2016-07-02 NOTE — H&P (Signed)
Isaac Peterson is an 61 y.o. male.    Chief Complaint: Pre-op LEFT robotic retroperitoneal partial nephrectomy  HPI:   1. Renal malignant neoplasm, left  - 2cm mid lateral heterogenous mass incidental by abd CT at [facility] 2017 at time of GI bleed. Dedicated MRI shows 50% exophytic posterior mid cystic mass 2.4cm with some enhancement c/w likely small clinically localized cystic renal cell. 1 artery / 1 vein renovascular anatomy. Open retroperitoneal window to kidney (no retrorenal colon).   2. Renal cyst, acquired, right - non-complex <2cm right simple cyst w/o mass effect by [place] 03/2016. Confirmed non-comples by MRI 05/2016.   3. Prostate cancer screening  - No FHX prostate cancer 03/2016 DRE - 35gm smoth / PSA 2.5  PMH sig for GI Bleed, sigmoid resection for diverticulitis (infraumbilical scar only), bilateral inguinal hernia repair as child. No CV disesae / blood thinners.   Today "Isaac Peterson" is seen to proceed with LEFT robotic partial neprhectomy with retroperitoneal approach.   Past Medical History:  Diagnosis Date  . Acute blood loss anemia 03/11/2016  . Arthritis of both hands 04/03/2015  . Diverticulosis of colon 03/12/2016  . GERD (gastroesophageal reflux disease)   . Heart murmur   . Hx of transfusion of packed red blood cells   . Hypertension 03/09/2016  . Large hiatal hernia 03/12/2016  . OSA (obstructive sleep apnea) 03/24/2016  . Prostate cancer screening 04/08/2016  . Renal cyst, acquired, right 04/08/2016  . SOBOE (shortness of breath on exertion) 03/24/2016    Past Surgical History:  Procedure Laterality Date  . BOWEL RESECTION  1993  . [place]   x3    Family History  Problem Relation Age of Onset  . Prostate cancer Neg Hx   . Kidney cancer Neg Hx    Social History:  reports that he has never smoked. He has never used smokeless tobacco. He reports that he does not drink alcohol or use drugs.  Allergies: No Known Allergies  No  prescriptions prior to admission.    Results for orders placed or performed during the hospital encounter of 06/30/16 (from the past 48 hour(s))  CBC     Status: None   Collection Time: 06/30/16  8:35 AM  Result Value Ref Range   WBC 5.2 4.0 - 10.5 K/uL   RBC 4.80 4.22 - 5.81 MIL/uL   Hemoglobin 13.8 13.0 - 17.0 g/dL   HCT 41.4 39.0 - 52.0 %   MCV 86.3 78.0 - 100.0 fL   MCH 28.8 26.0 - 34.0 pg   MCHC 33.3 30.0 - 36.0 g/dL   RDW 12.7 11.5 - 15.5 %   Platelets 255 150 - 400 K/uL  Basic metabolic panel     Status: Abnormal   Collection Time: 06/30/16  8:35 AM  Result Value Ref Range   Sodium 141 135 - 145 mmol/L   Potassium 3.9 3.5 - 5.1 mmol/L   Chloride 108 101 - 111 mmol/L   CO2 25 22 - 32 mmol/L   Glucose, Bld 109 (H) 65 - 99 mg/dL   BUN 14 6 - 20 mg/dL   Creatinine, Ser 0.95 0.61 - 1.24 mg/dL   Calcium 9.2 8.9 - 10.3 mg/dL   GFR calc non Af Amer >60 >60 mL/min   GFR calc Af Amer >60 >60 mL/min    Comment: (NOTE) The eGFR has been calculated using the CKD EPI equation. This calculation has not been validated in all clinical situations. eGFR's persistently <60  mL/min signify possible Chronic Kidney Disease.    Anion gap 8 5 - 15  Type and screen [place]     Status: None   Collection Time: 06/30/16  8:35 AM  Result Value Ref Range   ABO/RH(D) O POS    Antibody Screen NEG    Sample Expiration 07/14/2016    Extend sample reason NO TRANSFUSIONS OR PREGNANCY IN THE PAST 3 MONTHS   ABO/Rh     Status: None   Collection Time: 06/30/16  8:35 AM  Result Value Ref Range   ABO/RH(D) O POS    No results found.  Review of Systems  Constitutional: Negative.  Negative for chills and fever.  HENT: Negative.   Eyes: Negative.   Respiratory: Negative.   Cardiovascular: Negative.   Gastrointestinal: Negative.   Genitourinary: Negative.  Negative for hematuria.  Musculoskeletal: Negative.   Skin: Negative.   Neurological: Negative.   Endo/Heme/Allergies:  Negative.   Psychiatric/Behavioral: Negative.     There were no vitals taken for this visit. Physical Exam  Constitutional: He is oriented to person, place, and time. He appears well-developed.  HENT:  Head: Normocephalic.  Eyes: Pupils are equal, round, and reactive to light.  Neck: Normal range of motion.  Cardiovascular: Normal rate.   Respiratory: Effort normal.  GI:  Infraumbilical scar, stable.   Genitourinary:  Genitourinary Comments: NO CVAT.   Musculoskeletal: Normal range of motion.  Neurological: He is alert and oriented to person, place, and time.  Skin: Skin is warm.  Psychiatric: He has a normal mood and affect. His behavior is normal. Thought content normal.     Assessment/Plan  Proceed as planned with LEFT robotic partial nephrectomy. Risks, benefits, alternatives, expected peri-op course discussed previously and reiterated today.   Alexis Frock, MD 07/02/2016, 3:57 AM

## 2016-07-02 NOTE — Anesthesia Postprocedure Evaluation (Signed)
Anesthesia Post Note  Patient: Isaac Peterson  Procedure(s) Performed: Procedure(s) (LRB): XI ROBOTIC ASSITED RETROPERITONEAL PARTIAL NEPHRECTOMY (Left)  Patient location during evaluation: PACU Anesthesia Type: General Level of consciousness: awake and alert Pain management: pain level controlled Vital Signs Assessment: post-procedure vital signs reviewed and stable Respiratory status: spontaneous breathing, nonlabored ventilation, respiratory function stable and patient connected to nasal cannula oxygen Cardiovascular status: blood pressure returned to baseline and stable Postop Assessment: no signs of nausea or vomiting Anesthetic complications: no       Last Vitals:  Vitals:   07/02/16 1645 07/02/16 1700  BP: 127/84 125/86  Pulse: 80 79  Resp: 19 18  Temp:      Last Pain:  Vitals:   07/02/16 1700  TempSrc:   PainSc: 0-No pain                 Tiajuana Amass

## 2016-07-02 NOTE — Anesthesia Preprocedure Evaluation (Addendum)
Anesthesia Evaluation  Patient identified by MRN, date of birth, ID band Patient awake    Reviewed: Allergy & Precautions, NPO status , Patient's Chart, lab work & pertinent test results  Airway Mallampati: II  TM Distance: >3 FB Neck ROM: Full    Dental no notable dental hx. (+) Caps, Teeth Intact, Chipped,    Pulmonary shortness of breath and with exertion, sleep apnea and Continuous Positive Airway Pressure Ventilation ,    Pulmonary exam normal breath sounds clear to auscultation       Cardiovascular hypertension, Pt. on medications Normal cardiovascular exam+ Valvular Problems/Murmurs  Rhythm:Regular Rate:Normal     Neuro/Psych  Neuromuscular disease negative neurological ROS  negative psych ROS   GI/Hepatic hiatal hernia, GERD  Medicated and Controlled,Hx/o diverticulosis   Endo/Other  Hyperlipidemia  Renal/GU Renal diseaseLeft renal mass  negative genitourinary   Musculoskeletal  (+) Arthritis , Osteoarthritis,    Abdominal   Peds  Hematology  (+) anemia ,   Anesthesia Other Findings   Reproductive/Obstetrics                            Anesthesia Physical Anesthesia Plan  ASA: II  Anesthesia Plan: General   Post-op Pain Management:    Induction: Intravenous  Airway Management Planned: Oral ETT  Additional Equipment:   Intra-op Plan:   Post-operative Plan: Extubation in OR  Informed Consent: I have reviewed the patients History and Physical, chart, labs and discussed the procedure including the risks, benefits and alternatives for the proposed anesthesia with the patient or authorized representative who has indicated his/her understanding and acceptance.   Dental advisory given  Plan Discussed with: CRNA, Anesthesiologist and Surgeon  Anesthesia Plan Comments:         Anesthesia Quick Evaluation

## 2016-07-02 NOTE — Transfer of Care (Signed)
Immediate Anesthesia Transfer of Care Note  Patient: Isaac Peterson  Procedure(s) Performed: Procedure(s): XI ROBOTIC ASSITED RETROPERITONEAL PARTIAL NEPHRECTOMY (Left)  Patient Location: PACU  Anesthesia Type:General  Level of Consciousness:  sedated, patient cooperative and responds to stimulation  Airway & Oxygen Therapy:Patient Spontanous Breathing and Patient connected to face mask oxgen  Post-op Assessment:  Report given to PACU RN and Post -op Vital signs reviewed and stable  Post vital signs:  Reviewed and stable  Last Vitals:  Vitals:   07/02/16 1054  BP: (!) 142/86  Pulse: 83  Resp: 16  Temp: 123456 C    Complications: No apparent anesthesia complications

## 2016-07-02 NOTE — Brief Op Note (Signed)
07/02/2016  4:05 PM  PATIENT:  Isaac Peterson  61 y.o. male  PRE-OPERATIVE DIAGNOSIS:  LEFT RENAL MASS  POST-OPERATIVE DIAGNOSIS:  LEFT RENAL MASS  PROCEDURE:  Procedure(s): XI ROBOTIC ASSITED RETROPERITONEAL PARTIAL NEPHRECTOMY (Left)  SURGEON:  Surgeon(s) and Role:    * Alexis Frock, MD - Primary  PHYSICIAN ASSISTANT:   ASSISTANTS: Debbrah Alar, PA   ANESTHESIA:   local and general  EBL:  Total I/O In: 2000 [I.V.:2000] Out: 215 [Urine:165; Blood:50]  BLOOD ADMINISTERED:none  DRAINS: 1 -JP to bulb, 2 - foley to gravity   LOCAL MEDICATIONS USED:  MARCAINE     SPECIMEN:  Source of Specimen:  left partial nephrectomy  DISPOSITION OF SPECIMEN:  PATHOLOGY  COUNTS:  YES  TOURNIQUET:  * No tourniquets in log *  DICTATION: .Other Dictation: Dictation Number [id number]  PLAN OF CARE: Admit to inpatient   PATIENT DISPOSITION:  PACU - hemodynamically stable.   Delay start of Pharmacological VTE agent (>24hrs) due to surgical blood loss or risk of bleeding: yes

## 2016-07-02 NOTE — Discharge Instructions (Signed)

## 2016-07-02 NOTE — Anesthesia Procedure Notes (Signed)
Procedure Name: Intubation Date/Time: 07/02/2016 12:57 PM Performed by: Talbot Grumbling Pre-anesthesia Checklist: Patient identified, Emergency Drugs available, Suction available and Patient being monitored Patient Re-evaluated:Patient Re-evaluated prior to inductionOxygen Delivery Method: Circle system utilized Preoxygenation: Pre-oxygenation with 100% oxygen Intubation Type: IV induction Ventilation: Mask ventilation without difficulty Laryngoscope Size: Mac and 3 Grade View: Grade II Tube type: Oral Tube size: 8.0 mm Number of attempts: 1 Airway Equipment and Method: Stylet Placement Confirmation: ETT inserted through vocal cords under direct vision,  positive ETCO2 and breath sounds checked- equal and bilateral Secured at: 23 cm Tube secured with: Tape Dental Injury: Teeth and Oropharynx as per pre-operative assessment

## 2016-07-03 ENCOUNTER — Encounter (HOSPITAL_COMMUNITY): Payer: Self-pay | Admitting: Urology

## 2016-07-03 LAB — BASIC METABOLIC PANEL
ANION GAP: 7 (ref 5–15)
BUN: 12 mg/dL (ref 6–20)
CO2: 24 mmol/L (ref 22–32)
Calcium: 8.7 mg/dL — ABNORMAL LOW (ref 8.9–10.3)
Chloride: 103 mmol/L (ref 101–111)
Creatinine, Ser: 0.91 mg/dL (ref 0.61–1.24)
GFR calc Af Amer: >60 mL/min (ref >60)
GLUCOSE: 151 mg/dL — AB (ref 65–99)
POTASSIUM: 4.1 mmol/L (ref 3.5–5.1)
Sodium: 134 mmol/L — ABNORMAL LOW (ref 135–145)

## 2016-07-03 LAB — HEMOGLOBIN AND HEMATOCRIT, BLOOD
HEMATOCRIT: 38.8 % — AB (ref 39.0–52.0)
Hemoglobin: 13.1 g/dL (ref 13.0–17.0)

## 2016-07-03 MED ORDER — SODIUM CHLORIDE 0.9% FLUSH: 3.0000 mL | Freq: Two times a day (BID) | INTRAVENOUS | Status: DC

## 2016-07-03 MED ORDER — OXYCODONE HCL 5 MG PO TABS: 10.0000 mg | ORAL_TABLET | ORAL | Status: DC | PRN

## 2016-07-03 MED ORDER — OXYCODONE HCL 5 MG PO TABS
5.0000 mg | ORAL_TABLET | ORAL | Status: DC | PRN
  Administered 2016-07-03: 5 mg via ORAL
  Filled 2016-07-03 (×2): qty 1

## 2016-07-03 MED ORDER — SODIUM CHLORIDE 0.9% FLUSH: 3.0000 mL | INTRAVENOUS | Status: DC | PRN

## 2016-07-03 MED ORDER — SODIUM CHLORIDE 0.9 % IV SOLN: 250.0000 mL | INTRAVENOUS | Status: DC | PRN

## 2016-07-03 NOTE — Care Management Note (Signed)
Case Management Note  Patient Details  Name: Isaac Peterson MRN: [id number] Date of Birth: 12-Aug-1955  Subjective/Objective:  61 yo male pt admitted for Left robotic retroperitoneal partial nephrectomy                 Action/Plan: Plan to discharge home with no needs   Expected Discharge Date:  07/04/16               Expected Discharge Plan:  Home/Self Care  In-House Referral:  NA  Discharge planning Services  CM Consult  Post Acute Care Choice:  NA Choice offered to:  NA  DME Arranged:  N/A DME Agency:  NA  HH Arranged:  NA HH Agency:  NA  Status of Service:  In process, will continue to follow  If discussed at Long Length of Stay Meetings, dates discussed:    Additional CommentsPurcell Mouton, RN 07/03/2016, 11:10 AM

## 2016-07-03 NOTE — Discharge Summary (Signed)
Date of admission: 07/02/2016  Date of discharge: 07/03/2016  Admission diagnosis: left renal mass  Discharge diagnosis: left renal mass  Secondary diagnoses: none  History and Physical: For full details, please see admission history and physical. Briefly, Isaac Peterson is a 61 y.o. year old patient with left renal mass.   Hospital Course: Isaac Peterson is s/p left retroperitoneal robotic partial nephrectomy with Dr. Tresa Moore on 07/02/16. He did well post-operatively. His drain had minimal output and was removed on POD#1. His diet was slowly advanced and at the time of discharge he was tolerating a regular diet, ambulating at his baseline, was voiding spontaneously after foley catheter removal, and pain was well controlled with oral narcotics. He was discharged to home on POD#1.  General: Alert and oriented, sitting in chair CV: RRR Lungs: normal work of breathing on RA GI: Soft, Nondistended. Appropriately tender. Incisions: Clean and dry. No erythema or drainage. Mild flank bruising. JP has been removed. Extremities: Nontender, no erythema, no edema.  Laboratory values:  Recent Labs  07/02/16 1702 07/03/16 0447  HGB 14.2 13.1  HCT 41.6 38.8*    Recent Labs  07/03/16 0447  CREATININE 0.91    Disposition: Home  Discharge instruction: The patient was instructed to be ambulatory but told to refrain from heavy lifting, strenuous activity, or driving.   Discharge medications:  Allergies as of 07/03/2016   No Known Allergies     Medication List    STOP taking these medications   cholecalciferol 1000 units tablet Commonly known as:  VITAMIN D   ferrous sulfate 325 (65 FE) MG EC tablet   MULTI-VITAMINS Tabs   RA VITAMIN B-12 TR 1000 MCG Tbcr Generic drug:  Cyanocobalamin   Turmeric Curcumin 500 MG Caps     TAKE these medications   acetaminophen 500 MG tablet Commonly known as:  TYLENOL Take 1,000 mg by mouth every 8 (eight) hours as needed (pain).    HYDROcodone-acetaminophen 5-325 MG tablet Commonly known as:  NORCO Take 1-2 tablets by mouth every 6 (six) hours as needed for moderate pain or severe pain.   lisinopril-hydrochlorothiazide 20-25 MG tablet Commonly known as:  PRINZIDE,ZESTORETIC TAKE ONE TABLET BY MOUTH AT BEDTIME   pravastatin 40 MG tablet Commonly known as:  PRAVACHOL TAKE ONE TABLET BY MOUTH AY BEDTIME.       Followup:  Follow-up Information    Alexis Frock, MD On 07/14/2016.   Specialty:  Urology Why:  at 9:30 AM for MD visit at [facility] office. Dr. Tresa Moore will call you with pathology results when available.  Contact information: [address] [address] [phone]          I have seen and examined the patient and agree with plan as outlined here and on progress note from same date.

## 2016-07-03 NOTE — Op Note (Signed)
NAMERODRIGUE, BRABAND NO.:  [id number]  MEDICAL RECORD NO.:  [id number]  LOCATION:                                 FACILITY:  PHYSICIAN:  Alexis Frock, MD     DATE OF BIRTH:  1955/06/23  DATE OF PROCEDURE: 07/02/2016                              OPERATIVE REPORT   DIAGNOSIS:  Small left renal mass.  PROCEDURE:  Robotic-assisted laparoscopic left retroperitoneal partial nephrectomy.  ASSISTANT:  Debbrah Alar, PA.  ESTIMATED BLOOD LOSS:  50 mL, formed clot.  WARM ISCHEMIA TIME:  16 minutes.  FINDINGS: 1. Single relatively early branching artery, single vein left     renovascular anatomy. 2. Predominantly exophytic left lower posterior renal mass as     anticipated.  DRAINS: 1. Jackson-Pratt drain to bulb suction. 2. Foley catheter to straight drain.  INDICATION:  Isaac Peterson is a 61 year old gentleman, who was found incidentally to have a left enhancing renal mass.  Further characterization with MRI was highly concerning for a localized renal cell carcinoma.  He does have extensive surgical history status post bowel resection times several.  He was referred for consideration of management.  Options were discussed including ablative therapies versus surveillance protocol versus surgical extirpation with and without nephron sparing.  The patient adamantly wished to undergo partial nephrectomy.  Given surgical history, it was felt that a retroperitoneal approach will be most advantageous.  He was noted on preop imaging not to have a retrorenal colon or any other obvious contraindications to this.  Informed consent was obtained and placed in the medical record.  PROCEDURE IN DETAIL:  The patient was being Bryna Colander verified. Procedure being left robotic retroperitoneal partial nephrectomy was confirmed.  Procedure was carried out.  Time-out was performed. Intravenous antibiotics were administered.  General endotracheal anesthesia introduced.  The  patient was placed into a left side up full flank position, applying 15 degrees of table flexion, superior arm elevator, axillary roll, sequential compression devices, bottom leg bent, top leg straight.  Foley catheter was placed per urethra to straight drain.  He was positioned with his back lie nearly at the edge of the table.  He was further positioned using beanbag and 3-inch tape over foam padding.  Sterile field was created by prepping and draping the patient's entire left abdomen and flank using chlorhexidine gluconate.  Next, the incision approximately 1.5 cm in length was made approximately 1 fingerbreadth inferolateral to the tip of the 12th rib. Dissection proceeded down to the subcutaneous tissue to the area of the lumbodorsal fascia which was pierced using blunt Kelly clamp and further digital dissection was performed into the retroperitoneal space.  The psoas muscle was palpated inferiorly.  The lower pole of kidney was palpated superiorly and the peritoneum anteriorly corresponding to enter the retroperitoneal space.  This was further developed using finger dissection, sweeping the peritoneal edge away from anterior dissection as well as developing the space behind the lower pole of kidney, but anterior to the psoas muscle.  The Spacemaker balloon dilation apparatus was carefully positioned along the same axis.  Under laparoscopic vision, inflated to 30 pumps under direct vision, keeping the psoas musculature  in view at all times for orientation.  It allowed excellent development of retroperitoneal space.  This was taken down and using digital guidance, additional ports were placed as follows.  Left medial 8 mm robotic port.  This was placed directly onto the area of the psoas musculature posteriorly left medial anterior and robotic port placed at the maximal area of digital guidance palpating.  This area was not in apposition to colon and two 12-mm assistant port sites;  one approximately 3 cm inferolateral to the initial access site and one 3 cm inferomedial to the initial access site with the more posterior one being AirSeal type.  The Spacemaker retroperitoneal cannula was then used through the access site with the balloon inflated to 20 mL of air and a robotic camera port was piggybacked through this.  Robot was undocked and passed through the electronic checks.  Initial attention was directed to the dome of the retroperitoneum.  The plane posterior to the kidney, anterior to the psoas musculature was further developed superiorly towards the area of the diaphragm and then medially following pulsations of the aorta.  The ureter was encountered and swept medially, anteriorly and the posterior aspect of the renal parenchyma was visualized.  Using these 2 markers, there were traced to the area of the renal hilum.  Renal hilum consisted of single artery, which did branch much proximally, single vein renovascular anatomy.  The artery was marked with vessel loop.  Dissection then proceeded directly on the renal parenchyma posteriorly and the small mass was quickly detected. Fat overlying the mass was carefully dissected away and set aside as a perirenal mass fat to go with specimen and an area approximately 2 cm in circumference of the mass was carefully mobilized allowing visualization of its interface with the kidney.  As more anterior border to this, I did come fairly close to the area of the hilar vessels.  Warm ischemia was achieved using two bulldog clamps on the artery and partial nephrectomy was performed, first at the posterior aspect allowing visualization of the hilar vessels and then carried around anteriorly keeping what appeared to be a small rim of normal parenchyma with partial nephrectomy specimen.  Point coagulation current was used at several small arterials and venous sinuses.  Next, first-layer renorrhaphy was performed using running 3-0  V-Loc oversewing several small venous sinuses.  There were no entrances into collecting system. Given the depth of the mass, second-layer renorrhaphy was performed with a bolster and 3 interrupted 0 Vicryl sandwiched between Hem-o-Lok and Lapra-Ty, which resulted in excellent parenchymal apposition around the bolster.  The bulldog clamps were taken off and there was found to be complete hemostasis of the area of renorrhaphy.  Vessel loop was removed.  The specimen was placed into an EndoCatch bag.  A closed suction drain was brought through the previous medial most port site. The retroperitoneum robot was then undocked.  Specimen retrieved by removing the posterior most assistant port site.  All 12 mm sites and the initial access site were closed at the level of the lumbodorsal fascia using a figure-of-eight 0 Vicryl.  All incision sites were infiltrated with dilute lyophilized Marcaine and closed at the level of the skin using subcuticular Monocryl followed by Dermabond.  Procedure was then terminated.  The patient tolerated the procedure well.  No immediate periprocedural complications.  The patient was taken to the postanesthesia care in stable condition.   Please note surgical assistant Roland Earl was crucial for all robotic portions of  procedure. She provided invaluable retraction, suctioning, vascular clamping, and bedside assistance without which this would not be possible.   ______________________________ Alexis Frock, MD   ______________________________ Alexis Frock, MD    TM/MEDQ  D:  07/02/2016  T:  07/02/2016  Job:  [id number]

## 2016-07-03 NOTE — Progress Notes (Signed)
Patient is alert oriented x4, ambulatory without assist. pt denied questions, concerns related to discharge instruction. Pt reports having all belongings and stated his ride was waiting downstairs.

## 2016-07-03 NOTE — Progress Notes (Signed)
1 Day Post-Op Subjective: The patient is doing well.  No nausea or vomiting. Pain is adequately controlled. Labs stable. Hasn't ambulated yet. Drain output minimal (10ml).  Objective: Vital signs in last 24 hours: Temp:  [97.7 F (36.5 C)-98.4 F (36.9 C)] 98.2 F (36.8 C) (02/22 0522) Pulse Rate:  [60-88] 60 (02/22 0522) Resp:  [16-20] 18 (02/22 0522) BP: (91-142)/(51-91) 91/51 (02/22 0522) SpO2:  [93 %-100 %] 97 % (02/22 0522) Weight:  [88 kg (194 lb)] 88 kg (194 lb) (02/21 1125)  Intake/Output from previous day: 02/21 0701 - 02/22 0700 In: 4286.7 [I.V.:4286.7] Out: [id number] [Urine:1415; Drains:30; Blood:50] Intake/Output this shift: Total I/O In: 1186.7 [I.V.:1186.7] Out: 1280 [Urine:1250; Drains:30]  Physical Exam:  General: Alert and oriented. CV: RRR Lungs: normal work of breathing on [place] GI: Soft, Nondistended. Appropriately tender. Incisions: Clean and dry. No erythema or drainage. Mild flank bruising. JP with scant SS fluid. Urine: Clear Extremities: Nontender, no erythema, no edema.  Lab Results:  Recent Labs  06/30/16 0835 07/02/16 1702 07/03/16 0447  HGB 13.8 14.2 13.1  HCT 41.4 41.6 38.8*          Recent Labs  06/30/16 0835 07/03/16 0447  CREATININE 0.95 0.91           Results for orders placed or performed during the hospital encounter of 07/02/16 (from the past 24 hour(s))  Hemoglobin and hematocrit, blood     Status: None   Collection Time: 07/02/16  5:02 PM  Result Value Ref Range   Hemoglobin 14.2 13.0 - 17.0 g/dL   HCT 41.6 39.0 - [id number] %  Basic metabolic panel     Status: Abnormal   Collection Time: 07/03/16  4:47 AM  Result Value Ref Range   Sodium 134 (L) 135 - 145 mmol/L   Potassium 4.1 3.5 - 5.1 mmol/L   Chloride 103 101 - 111 mmol/L   CO2 24 22 - 32 mmol/L   Glucose, Bld 151 (H) 65 - 99 mg/dL   BUN 12 6 - 20 mg/dL   Creatinine, Ser 0.91 0.61 - 1.24 mg/dL   Calcium 8.7 (L) 8.9 - 10.3 mg/dL   GFR calc non Af Amer >60 >60 mL/min   GFR calc Af Amer >60 >60 mL/min   Anion gap 7 5 - 15  Hemoglobin and hematocrit, blood     Status: Abnormal   Collection Time: 07/03/16  4:47 AM  Result Value Ref Range   Hemoglobin 13.1 13.0 - 17.0 g/dL   HCT 38.8 (L) 39.0 - 52.0 %    Assessment/Plan: POD# 1 s/p left robotic retroperitoneal partial nephrectomy.  1) Ambulate, Incentive spirometry 2) Advance diet as tolerated 3) Transition to oral pain medication 4) Medlock IVF 5) D/C urethral catheter 6) Possible discharge later today vs tomorrow AM. Will remove drain prior to discharge.    LOS: 1 day   Lolita Rieger 07/03/2016, 6:57 AM    I have seen and examined the patietn and agree with above.  Briefly,  S: POD 1 s/p LEFT robotic retroperitoneal partial nephrectomy. Pain controlled, tollerating PO.  O: Cr 0.9, Hgb 13.1. NAD, AOx3 Non-labored breathing on minimal NCO2 RRR Left flank JP with scant serosanguinous fluid that is non-foul. Removed and dry dressing applied. Other incision sites c/d/i. SCD's in place w/o c/c/e  A/P: Doing well POD 1, likely DC later today.

## 2016-11-07 ENCOUNTER — Ambulatory Visit: Admission: RE | Admit: 2016-11-07 | Payer: BLUE CROSS/BLUE SHIELD | Source: Ambulatory Visit

## 2016-11-07 ENCOUNTER — Ambulatory Visit
Admission: RE | Admit: 2016-11-07 | Discharge: 2016-11-07 | Disposition: A | Discharge status: Home / Self Care | Payer: BLUE CROSS/BLUE SHIELD | Source: Ambulatory Visit | Attending: Urology | Admitting: Urology

## 2016-11-07 DIAGNOSIS — N281 Cyst of kidney, acquired: Secondary | ICD-10-CM | POA: Diagnosis not present

## 2016-11-07 DIAGNOSIS — K5730 Diverticulosis of large intestine without perforation or abscess without bleeding: Secondary | ICD-10-CM | POA: Insufficient documentation

## 2016-11-07 DIAGNOSIS — K760 Fatty (change of) liver, not elsewhere classified: Secondary | ICD-10-CM | POA: Diagnosis not present

## 2016-11-07 DIAGNOSIS — K449 Diaphragmatic hernia without obstruction or gangrene: Secondary | ICD-10-CM | POA: Insufficient documentation

## 2016-11-07 DIAGNOSIS — Z85528 Personal history of other malignant neoplasm of kidney: Secondary | ICD-10-CM | POA: Insufficient documentation

## 2016-11-07 DIAGNOSIS — C642 Malignant neoplasm of left kidney, except renal pelvis: Secondary | ICD-10-CM | POA: Diagnosis present

## 2016-11-07 DIAGNOSIS — R911 Solitary pulmonary nodule: Secondary | ICD-10-CM | POA: Insufficient documentation

## 2016-11-07 HISTORY — DX: Malignant neoplasm of left kidney, except renal pelvis: C64.2

## 2016-11-07 MED ORDER — IOPAMIDOL (ISOVUE-370) INJECTION 76%
100.0000 mL | Freq: Once | INTRAVENOUS | Status: AC | PRN
  Administered 2016-11-07: 100 mL via INTRAVENOUS

## 2016-11-18 ENCOUNTER — Ambulatory Visit: Payer: BLUE CROSS/BLUE SHIELD

## 2016-12-26 ENCOUNTER — Encounter: Payer: Self-pay | Admitting: Urology

## 2016-12-26 ENCOUNTER — Ambulatory Visit (INDEPENDENT_AMBULATORY_CARE_PROVIDER_SITE_OTHER): Payer: BLUE CROSS/BLUE SHIELD | Admitting: Urology

## 2016-12-26 VITALS — BP 117/72 | HR 66 | Ht 69.0 in | Wt 195.9 lb

## 2016-12-26 DIAGNOSIS — N281 Cyst of kidney, acquired: Secondary | ICD-10-CM | POA: Diagnosis not present

## 2016-12-26 DIAGNOSIS — C642 Malignant neoplasm of left kidney, except renal pelvis: Secondary | ICD-10-CM | POA: Diagnosis not present

## 2016-12-26 DIAGNOSIS — Z125 Encounter for screening for malignant neoplasm of prostate: Secondary | ICD-10-CM | POA: Diagnosis not present

## 2016-12-26 NOTE — Progress Notes (Signed)
12/26/2016 9:21 AM   Jenny Reichmann Dennie Fetters [id number]  Referring provider: Sherrin Daisy, MD No address on file  No chief complaint on file.   HPI:  1. 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  Post-op Surveillance: 12/2016 - CT no recurrene  2. Renal cyst, acquired, right- non-complex <2cm right simple cyst w/o mass effect by [place] 03/2016. Confirmed non-comples by MRI 05/2016.   3. Prostate cancer screening - No FHX prostate cancer 03/2016 DRE - 35gm smoth / PSA 2.5  PMH sig for GI Bleed, sigmoid resection for diverticulitis (infraumbilical scar only), bilateral inguinal hernia repair as child. No CV disesae / blood thinners.   Today "Isaac Peterson" is seen in f/u above. Interval CT reassuring without recurrence.    PMH: Past Medical History:  Diagnosis Date  . Acute blood loss anemia 03/11/2016  . Arthritis of both hands 04/03/2015  . Diverticulosis of colon 03/12/2016  . GERD (gastroesophageal reflux disease)   . Heart murmur   . Hx of transfusion of packed red blood cells   . Hypertension 03/09/2016  . Large hiatal hernia 03/12/2016  . OSA (obstructive sleep apnea) 03/24/2016  . Prostate cancer screening 04/08/2016  . Renal cancer, left ([place]) 03/2016   Hx Partila Left Nephrectomy  . Renal cyst, acquired, right 04/08/2016  . SOBOE (shortness of breath on exertion) 03/24/2016    Surgical History: Past Surgical History:  Procedure Laterality Date  . BOWEL RESECTION  1993  . [place]   x3  . ROBOTIC ASSITED PARTIAL NEPHRECTOMY Left 07/02/2016   Procedure: XI ROBOTIC ASSITED RETROPERITONEAL PARTIAL NEPHRECTOMY;  Surgeon: Alexis Frock, MD;  Location: [facility];  Service: Urology;  Laterality: Left;    Home Medications:  Allergies as of 12/26/2016   No Known Allergies     Medication List       Accurate as of 12/26/16  9:21 AM. Always use your  most recent med list.          acetaminophen 500 MG tablet Commonly known as:  TYLENOL Take 1,000 mg by mouth every 8 (eight) hours as needed (pain).   HYDROcodone-acetaminophen 5-325 MG tablet Commonly known as:  NORCO Take 1-2 tablets by mouth every 6 (six) hours as needed for moderate pain or severe pain.   lisinopril-hydrochlorothiazide 20-25 MG tablet Commonly known as:  PRINZIDE,ZESTORETIC TAKE ONE TABLET BY MOUTH AT BEDTIME   pravastatin 40 MG tablet Commonly known as:  PRAVACHOL TAKE ONE TABLET BY MOUTH AY BEDTIME.       Allergies: No Known Allergies  Family History: Family History  Problem Relation Age of Onset  . Prostate cancer Neg Hx   . Kidney cancer Neg Hx     Social History:  reports that he has never smoked. He has never used smokeless tobacco. He reports that he does not drink alcohol or use drugs.    Review of Systems  Gastrointestinal (upper)  : Negative for upper GI symptoms  Gastrointestinal (lower) : Negative for lower GI symptoms  Constitutional : Negative for symptoms  Skin: Negative for skin symptoms  Eyes: Negative for eye symptoms  Ear/Nose/Throat : Negative for Ear/Nose/Throat symptoms  Hematologic/Lymphatic: Negative for Hematologic/Lymphatic symptoms  Cardiovascular : Negative for cardiovascular symptoms  Respiratory : Negative for respiratory symptoms  Endocrine: Negative for endocrine symptoms  Musculoskeletal: Negative for musculoskeletal symptoms  Neurological: Negative for neurological symptoms  Psychologic: Negative for  psychiatric symptoms   Physical Exam: There were no vitals taken for this visit.  Constitutional:  Alert and oriented, No acute distress. HEENT: [place] AT, moist mucus membranes.  Trachea midline, no masses. Cardiovascular: No clubbing, cyanosis, or edema. Respiratory: Normal respiratory effort, no increased work of breathing. GI: Abdomen is soft, nontender, nondistended, no abdominal  masses GU: No CVA tenderness.  Skin: No rashes, bruises or suspicious lesions. Lymph: No cervical or inguinal adenopathy. Neurologic: Grossly intact, no focal deficits, moving all 4 extremities. Psychiatric: Normal mood and affect.  Laboratory Data: Lab Results  Component Value Date   WBC 5.2 06/30/2016   HGB 13.1 07/03/2016   HCT 38.8 (L) 07/03/2016   MCV 86.3 06/30/2016   PLT 255 06/30/2016    Lab Results  Component Value Date   CREATININE 0.91 07/03/2016    No results found for: PSA  No results found for: TESTOSTERONE  No results found for: HGBA1C  Urinalysis No results found for: COLORURINE, APPEARANCEUR, LABSPEC, PHURINE, GLUCOSEU, HGBUR, BILIRUBINUR, KETONESUR, PROTEINUR, UROBILINOGEN, NITRITE, LEUKOCYTESUR  Pertinent Imaging: Reviewed as per above.   Assessment & Plan:    1. Renal malignant neoplasm, left (HCC) - no recurrence by surveillance imaging. Now rec yearly CT x 3 per stage 1 protocol.   2. Renal cyst, acquired, right - unlike left cancer, theese much lower risk and will be surveilled as per above.   3. Prostate cancer screening - up to date, continue yearly until age 45 or so as average risk  RTC 1 year with CMP, PSA, CXR, CT    Alexis Frock, MD  [facility] [address] [place],  [address] [phone]

## 2017-12-09 ENCOUNTER — Ambulatory Visit
Admission: RE | Admit: 2017-12-09 | Discharge: 2017-12-09 | Disposition: A | Discharge status: Home / Self Care | Payer: BLUE CROSS/BLUE SHIELD | Source: Ambulatory Visit | Attending: Urology | Admitting: Urology

## 2017-12-09 DIAGNOSIS — C642 Malignant neoplasm of left kidney, except renal pelvis: Secondary | ICD-10-CM

## 2017-12-09 DIAGNOSIS — N281 Cyst of kidney, acquired: Secondary | ICD-10-CM

## 2017-12-09 DIAGNOSIS — K760 Fatty (change of) liver, not elsewhere classified: Secondary | ICD-10-CM | POA: Diagnosis not present

## 2017-12-09 DIAGNOSIS — K5750 Diverticulosis of both small and large intestine without perforation or abscess without bleeding: Secondary | ICD-10-CM | POA: Insufficient documentation

## 2017-12-09 DIAGNOSIS — K449 Diaphragmatic hernia without obstruction or gangrene: Secondary | ICD-10-CM | POA: Insufficient documentation

## 2017-12-09 DIAGNOSIS — Z905 Acquired absence of kidney: Secondary | ICD-10-CM | POA: Insufficient documentation

## 2017-12-09 MED ORDER — IOPAMIDOL (ISOVUE-370) INJECTION 76%
75.0000 mL | Freq: Once | INTRAVENOUS | Status: AC | PRN
  Administered 2017-12-09: 75 mL via INTRAVENOUS

## 2017-12-25 ENCOUNTER — Other Ambulatory Visit
Admission: RE | Admit: 2017-12-25 | Discharge: 2017-12-25 | Disposition: A | Discharge status: Home / Self Care | Payer: BLUE CROSS/BLUE SHIELD | Source: Ambulatory Visit | Attending: Urology | Admitting: Urology

## 2017-12-25 ENCOUNTER — Other Ambulatory Visit: Payer: BLUE CROSS/BLUE SHIELD

## 2017-12-28 ENCOUNTER — Other Ambulatory Visit: Payer: Self-pay | Admitting: Family Medicine

## 2017-12-28 ENCOUNTER — Other Ambulatory Visit: Payer: BLUE CROSS/BLUE SHIELD

## 2017-12-28 ENCOUNTER — Ambulatory Visit: Payer: BLUE CROSS/BLUE SHIELD

## 2017-12-28 DIAGNOSIS — Z125 Encounter for screening for malignant neoplasm of prostate: Secondary | ICD-10-CM

## 2017-12-29 LAB — PSA: Prostate Specific Ag, Serum: 2.9 ng/mL (ref 0.0–4.0)

## 2018-01-01 ENCOUNTER — Encounter: Payer: Self-pay | Admitting: Urology

## 2018-01-01 ENCOUNTER — Ambulatory Visit: Payer: BLUE CROSS/BLUE SHIELD | Admitting: Urology

## 2018-01-01 VITALS — BP 124/80 | HR 62 | Ht 69.0 in | Wt 194.4 lb

## 2018-01-01 DIAGNOSIS — Z85528 Personal history of other malignant neoplasm of kidney: Secondary | ICD-10-CM | POA: Diagnosis not present

## 2018-01-01 NOTE — Progress Notes (Signed)
   01/01/2018 10:17 AM   Isaac Peterson 09-17-1955 [id number]  Reason for visit: Follow up partial nephrectomy for SRM  HPI: I had the pleasure of seeing Isaac Peterson in urology clinic today for follow-up.  Briefly, he is a 62 year old male who underwent a robotic left partial nephrectomy with Dr. 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  Surveillance imaging has thus far been negative.  He denies any new complaints, including gross hematuria, flank pain, weight loss.  Overall he is doing well.   ROS: Please see flowsheet from today's date for complete review of systems.  Physical Exam: There were no vitals taken for this visit.   Constitutional:  Alert and oriented, No acute distress. Respiratory: Normal respiratory effort, no increased work of breathing. GI: Abdomen is soft, nontender, nondistended, no abdominal masses GU: No CVA tenderness Skin: No rashes, bruises or suspicious lesions. Neurologic: Grossly intact, no focal deficits, moving all 4 extremities. Psychiatric: Normal mood and affect  Laboratory Data: PSA 2.9(2.5) Cr 0.8, eGFR 98  Pertinent Imaging:  I have personally reviewed the CT A/P dated 12/09/2017-no evidence of recurrent or metastatic disease.  Chest x-ray with no definite pulmonary mass or nodule, however the 4 mm diameter nodule seen on prior CT may be inapparent and instability is uncertain, recommend follow-up CT chest  Assessment & Plan:   In summary, Isaac Peterson is a 62 year old male with history of robotic left partial nephrectomy with Dr. Bess Harvest for stage pT1a papillary RCC.  No recurrence on surveillance imaging today, renal function is normal. Doing well.   Follow-up in 1 year with a renal ultrasound, and CT chest Per AUA guidelines, considering stopping surveillance in 06/2019  Billey Co, [facility] [address], [place], [place] [address] [phone]

## 2018-07-14 DIAGNOSIS — Z79899 Other long term (current) drug therapy: Secondary | ICD-10-CM | POA: Insufficient documentation

## 2018-12-31 ENCOUNTER — Ambulatory Visit: Payer: BLUE CROSS/BLUE SHIELD | Admitting: Urology

## 2019-01-06 ENCOUNTER — Other Ambulatory Visit: Payer: Self-pay

## 2019-01-06 ENCOUNTER — Encounter: Payer: Self-pay | Admitting: Urology

## 2019-01-06 ENCOUNTER — Ambulatory Visit: Payer: BC Managed Care – PPO | Admitting: Urology

## 2019-01-06 VITALS — BP 131/77 | HR 74 | Ht 69.0 in | Wt 182.0 lb

## 2019-01-06 DIAGNOSIS — Z85528 Personal history of other malignant neoplasm of kidney: Secondary | ICD-10-CM | POA: Diagnosis not present

## 2019-01-06 NOTE — Progress Notes (Signed)
   01/06/2019 3:32 PM   Wess Botts [birth year] [id number]  Reason for visit: Follow up history of partial nephrectomy  HPI: Briefly, he is a 63 year old male who underwent a robotic left partial nephrectomy with Dr. 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  Surveillance imaging has thus far been negative.  He was supposed to have a renal ultrasound and CT chest prior to his visit today for routine surveillance, however these have not yet been performed.  He denies any complaints since we saw him last, specifically weight loss, flank pain, gross hematuria, or bone pain.   ROS: Please see flowsheet from today's date for complete review of systems.  Physical Exam: BP 131/77   Pulse 74   Ht 5\' 9"  (1.753 m)   Wt 182 lb (82.6 kg)   BMI 26.88 kg/m     Assessment & Plan:   63 year old male with history of robotic left partial nephrectomy with Dr. 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  Staging imaging has thus far been negative, however he did not obtain his imaging prior to today's clinic visit.  We discussed the need for ongoing surveillance.  Per the AUA guidelines, can discontinue surveillance imaging in February 2021.  -We will call with renal ultrasound and CT chest results -If negative, will repeat renal ultrasound and chest x-ray in February 2021, then discontinue surveillance at that time if negative -Continue routine PSA screening with PCP  A total of 15 minutes were spent face-to-face with the patient, greater than 50% was spent in patient education, counseling, and coordination of care regarding postop surveillance after partial nephrectomy.   Billey Co, [facility] [address] [place], [place] [address] [phone]

## 2019-01-27 ENCOUNTER — Other Ambulatory Visit: Payer: Self-pay

## 2019-01-27 ENCOUNTER — Ambulatory Visit
Admission: RE | Admit: 2019-01-27 | Discharge: 2019-01-27 | Disposition: A | Discharge status: Home / Self Care | Payer: BC Managed Care – PPO | Source: Ambulatory Visit | Attending: Urology | Admitting: Urology

## 2019-01-27 DIAGNOSIS — Z85528 Personal history of other malignant neoplasm of kidney: Secondary | ICD-10-CM

## 2019-01-27 MED ORDER — IOHEXOL 300 MG/ML  SOLN
75.0000 mL | Freq: Once | INTRAMUSCULAR | Status: AC | PRN
  Administered 2019-01-27: 15:00:00 75 mL via INTRAVENOUS

## 2019-01-31 ENCOUNTER — Other Ambulatory Visit: Payer: Self-pay | Admitting: Family Medicine

## 2019-01-31 ENCOUNTER — Encounter: Payer: Self-pay | Admitting: Family Medicine

## 2019-01-31 DIAGNOSIS — Z85528 Personal history of other malignant neoplasm of kidney: Secondary | ICD-10-CM

## 2019-02-01 NOTE — Telephone Encounter (Signed)
App made and mailed to patient ° °Isaac Peterson °

## 2019-02-01 NOTE — Telephone Encounter (Signed)
-----   Message from Billey Co, MD sent at 01/31/2019 12:03 PM EDT ----- Renal US and CT chest normal, please schedule follow up in Februrary 2021 for renal ultrasound and CXR as discussed in clinic  Nickolas Madrid, MD 01/31/2019

## 2019-06-07 ENCOUNTER — Other Ambulatory Visit: Payer: Self-pay | Admitting: Radiology

## 2019-06-07 DIAGNOSIS — Z85528 Personal history of other malignant neoplasm of kidney: Secondary | ICD-10-CM

## 2019-06-27 ENCOUNTER — Other Ambulatory Visit: Payer: Self-pay

## 2019-06-27 ENCOUNTER — Ambulatory Visit
Admission: RE | Admit: 2019-06-27 | Discharge: 2019-06-27 | Disposition: A | Discharge status: Home / Self Care | Payer: BC Managed Care – PPO | Source: Ambulatory Visit | Attending: Urology | Admitting: Urology

## 2019-06-27 ENCOUNTER — Ambulatory Visit: Payer: BC Managed Care – PPO | Admitting: Urology

## 2019-06-27 DIAGNOSIS — Z85528 Personal history of other malignant neoplasm of kidney: Secondary | ICD-10-CM | POA: Insufficient documentation

## 2019-06-29 ENCOUNTER — Encounter: Payer: Self-pay | Admitting: Urology

## 2019-06-29 ENCOUNTER — Other Ambulatory Visit: Payer: Self-pay

## 2019-06-29 ENCOUNTER — Ambulatory Visit
Admission: RE | Admit: 2019-06-29 | Discharge: 2019-06-29 | Disposition: A | Discharge status: Home / Self Care | Payer: BC Managed Care – PPO | Source: Ambulatory Visit | Attending: Urology | Admitting: Urology

## 2019-06-29 ENCOUNTER — Ambulatory Visit (INDEPENDENT_AMBULATORY_CARE_PROVIDER_SITE_OTHER): Payer: BC Managed Care – PPO | Admitting: Urology

## 2019-06-29 VITALS — BP 143/82 | HR 68 | Ht 69.0 in | Wt 181.5 lb

## 2019-06-29 DIAGNOSIS — Z85528 Personal history of other malignant neoplasm of kidney: Secondary | ICD-10-CM | POA: Insufficient documentation

## 2019-06-29 LAB — URINALYSIS, COMPLETE
Bilirubin, UA: NEGATIVE
Glucose, UA: NEGATIVE
Ketones, UA: NEGATIVE
Leukocytes,UA: NEGATIVE
Nitrite, UA: NEGATIVE
Protein,UA: NEGATIVE
RBC, UA: NEGATIVE
Specific Gravity, UA: 1.025 (ref 1.005–1.030)
Urobilinogen, Ur: 0.2 mg/dL (ref 0.2–1.0)
pH, UA: 7 (ref 5.0–7.5)

## 2019-06-29 LAB — MICROSCOPIC EXAMINATION
Bacteria, UA: NONE SEEN
Epithelial Cells (non renal): NONE SEEN /hpf (ref 0–10)
RBC, Urine: NONE SEEN /hpf (ref 0–2)

## 2019-06-29 NOTE — Progress Notes (Signed)
   06/29/2019 1:35 PM   Wess Botts 02-25-1956 [id number]  Reason for visit: Follow up history of partial nephrectomy  HPI: I saw Mr. Tawil back in clinic for routine surveillance imaging for history of a partial nephrectomy.  He is a 64 year old male who underwent a robotic left partial nephrectomy with Dr. Rex Kras February 2018, with final pathology showing 2.1cm papillary RCC, Fuhrman grade 2, negative margins, stage pT1a.Surveillance imaging has thus far been negative.  He denies any complaints since we saw him last, specifically weight loss, flank pain, gross hematuria, or bone pain.  Most recent imaging with renal ultrasound on 06/27/2019 no abnormal findings, he has not yet had his chest x-ray done.   We again reviewed the AUA guidelines that do not recommend routine screening after 3 years for patients with his pathology findings on partial nephrectomy.  We discussed the very low, but not 0, risk of recurrence.  We discussed return precautions at length including flank pain, gross hematuria, or weight loss.  Chest x-ray today, call with results If chest x-ray negative, follow-up as needed Continue yearly PSA screening with PCP  I spent 20 total minutes on the day of the encounter including pre-visit review of the medical record, face-to-face time with the patient, and post visit ordering of labs/imaging/tests.  Billey Co, [facility] [address], [place], [place] [address] [phone]

## 2019-06-29 NOTE — Patient Instructions (Signed)
Prostate Cancer Screening  Prostate cancer screening is a test that is done to check for the presence of prostate cancer in men. The prostate gland is a walnut-sized gland that is located below the bladder and in front of the rectum in males. The function of the prostate is to add fluid to semen during ejaculation. Prostate cancer is the second most common type of cancer in men. Who should have prostate cancer screening?  Screening recommendations vary based on age and other risk factors. Screening is recommended if:  You are older than age 55. If you are age 55-69, talk with your health care provider about your need for screening and how often screening should be done. Because most prostate cancers are slow growing and will not cause death, screening is generally reserved in this age group for men who have a 10-15-year life expectancy.  You are younger than age 55, and you have these risk factors: ? Being a black male or a male of African descent. ? Having a father, brother, or uncle who has been diagnosed with prostate cancer. The risk is higher if your family member's cancer occurred at an early age. Screening is not recommended if:  You are younger than age 40.  You are between the ages of 40 and 54 and you have no risk factors.  You are 70 years of age or older. At this age, the risks that screening can cause are greater than the benefits that it may provide. If you are at high risk for prostate cancer, your health care provider may recommend that you have screenings more often or that you start screening at a younger age. How is screening for prostate cancer done? The recommended prostate cancer screening test is a blood test called the prostate-specific antigen (PSA) test. PSA is a protein that is made in the prostate. As you age, your prostate naturally produces more PSA. Abnormally high PSA levels may be caused by:  Prostate cancer.  An enlarged prostate that is not caused by cancer  (benign prostatic hyperplasia, BPH). This condition is very common in older men.  A prostate gland infection (prostatitis). Depending on the PSA results, you may need more tests, such as:  A physical exam to check the size of your prostate gland.  Blood and imaging tests.  A procedure to remove tissue samples from your prostate gland for testing (biopsy). What are the benefits of prostate cancer screening?  Screening can help to identify cancer at an early stage, before symptoms start and when the cancer can be treated more easily.  There is a small chance that screening may lower your risk of dying from prostate cancer. The chance is small because prostate cancer is a slow-growing cancer, and most men with prostate cancer die from a different cause. What are the risks of prostate cancer screening? The main risk of prostate cancer screening is diagnosing and treating prostate cancer that would never have caused any symptoms or problems. This is called overdiagnosisand overtreatment. PSA screening cannot tell you if your PSA is high due to cancer or a different cause. A prostate biopsy is the only procedure to diagnose prostate cancer. Even the results of a biopsy may not tell you if your cancer needs to be treated. Slow-growing prostate cancer may not need any treatment other than monitoring, so diagnosing and treating it may cause unnecessary stress or other side effects. A prostate biopsy may also cause:  Infection or fever.  A false negative. This is   a result that shows that you do not have prostate cancer when you actually do have prostate cancer. Questions to ask your health care provider  When should I start prostate cancer screening?  What is my risk for prostate cancer?  How often do I need screening?  What type of screening tests do I need?  How do I get my test results?  What do my results mean?  Do I need treatment? Where to find more information  The American Cancer  Society: www.cancer.org  American Urological Association: www.auanet.org Contact a health care provider if:  You have difficulty urinating.  You have pain when you urinate or ejaculate.  You have blood in your urine or semen.  You have pain in your back or in the area of your prostate. Summary  Prostate cancer is a common type of cancer in men. The prostate gland is located below the bladder and in front of the rectum. This gland adds fluid to semen during ejaculation.  Prostate cancer screening may identify cancer at an early stage, when the cancer can be treated more easily.  The prostate-specific antigen (PSA) test is the recommended screening test for prostate cancer.  Discuss the risks and benefits of prostate cancer screening with your health care provider. If you are age 70 or older, the risks that screening can cause are greater than the benefits that it may provide. This information is not intended to replace advice given to you by your health care provider. Make sure you discuss any questions you have with your health care provider. Document Revised: 12/09/2018 Document Reviewed: 12/09/2018 Elsevier Patient Education  2020 Elsevier Inc.  

## 2019-07-01 ENCOUNTER — Telehealth: Payer: Self-pay

## 2019-07-01 NOTE — Telephone Encounter (Signed)
Called pt informed him of the information below. Pt gave verbal understanding.  

## 2019-07-01 NOTE — Telephone Encounter (Signed)
-----   Message from Billey Co, MD sent at 06/30/2019 11:54 AM EST ----- No abnormalities on chest x-ray.  Follow-up as needed as discussed in clinic  Nickolas Madrid, MD 06/30/2019

## 2019-07-28 IMAGING — CT CT ABDOMEN WO/W CM
3 of 12 series · 12 of 46 positions shown, 18 images · IV contrast (iopamidol)
Comparison: 11/07/2016

CLINICAL DATA: Followup left renal cell carcinoma. Status post
partial left nephrectomy.

EXAM:
CT ABDOMEN WITHOUT AND WITH CONTRAST
TECHNIQUE: Multidetector CT imaging of the abdomen was performed following the
standard protocol before and following the bolus administration of
intravenous contrast.
CONTRAST:  75mL GKH2YO-4FH IOPAMIDOL (GKH2YO-4FH) INJECTION 76%

[Series 3: coronal pre · coronal · non-contrast · 0.58mm/px · 2 of 99 slices shown, 3 images]
[im 33/99  soft-tissue]
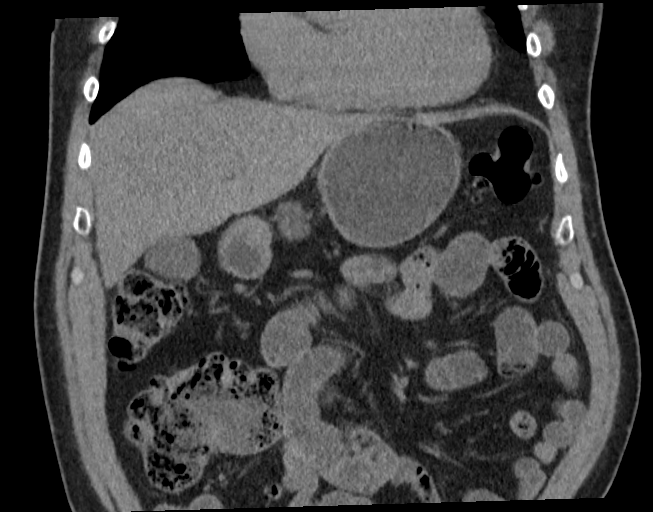
[im 33/99  bone]
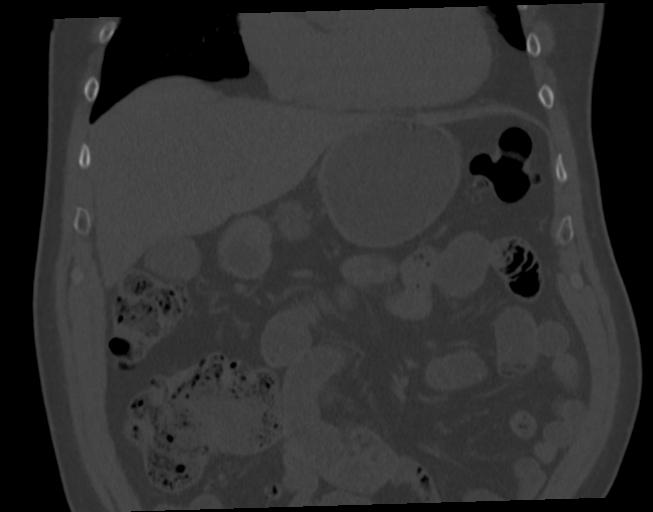
[im 66/99  soft-tissue]
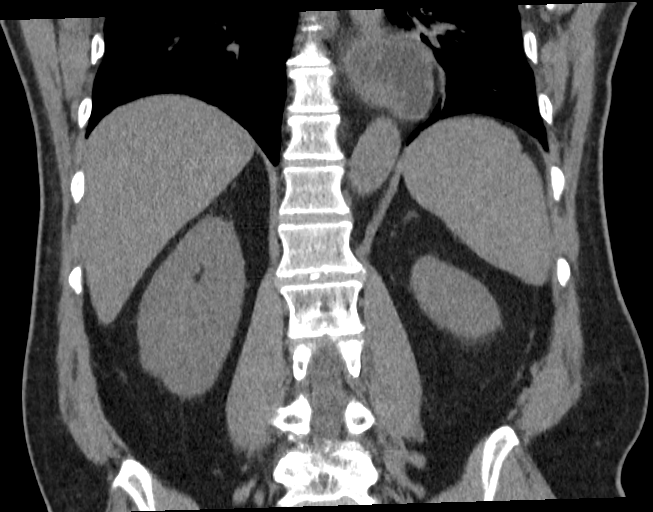

[Series 6: renal arterial · axial · arterial · 0.67mm/px · z∈[+337,+550]mm · 7 of 95 slices shown, 12 images]
[im 12/95  soft-tissue]
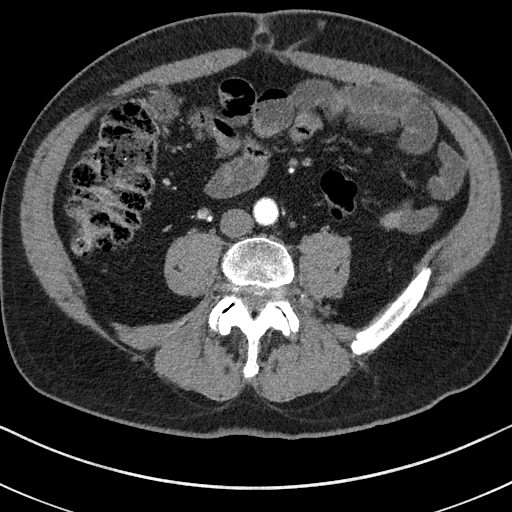
[im 12/95  bone]
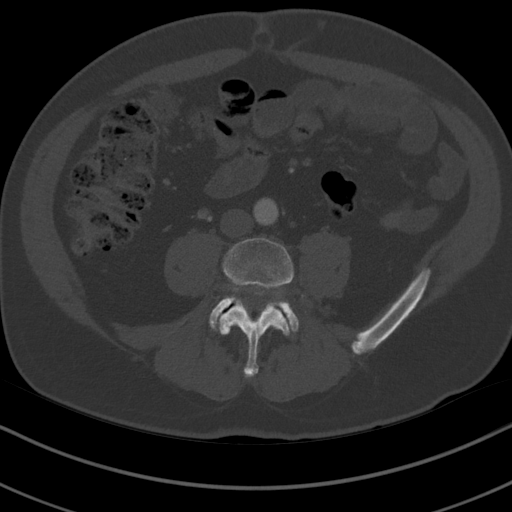
[im 24/95  soft-tissue]
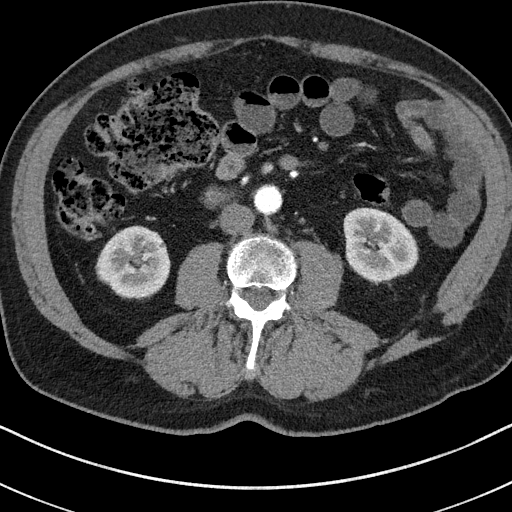
[im 36/95  soft-tissue]
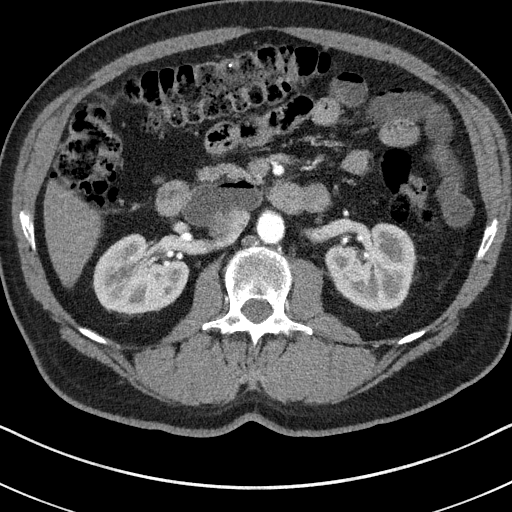
[im 48/95  soft-tissue]
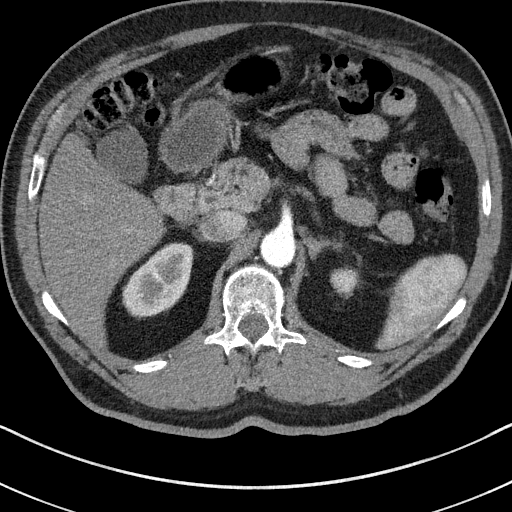
[im 48/95  lung]
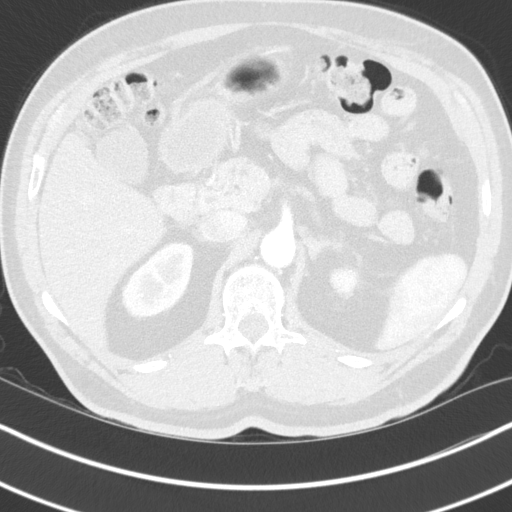
[im 59/95  soft-tissue]
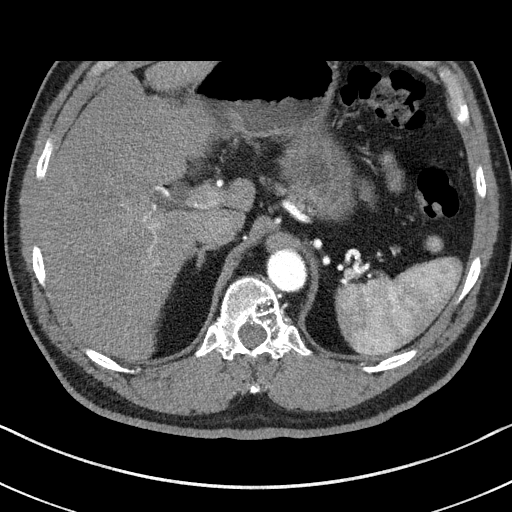
[im 59/95  lung]
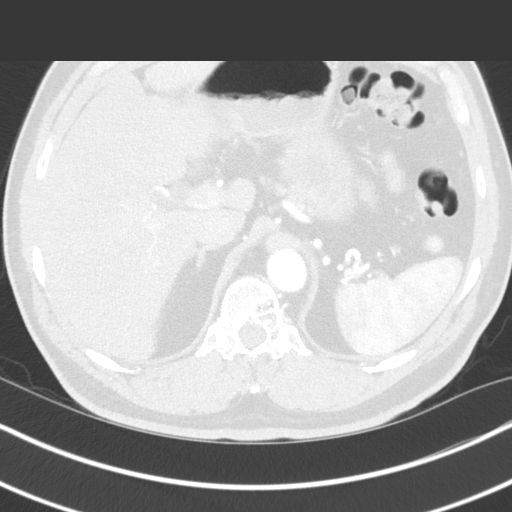
[im 71/95  soft-tissue]
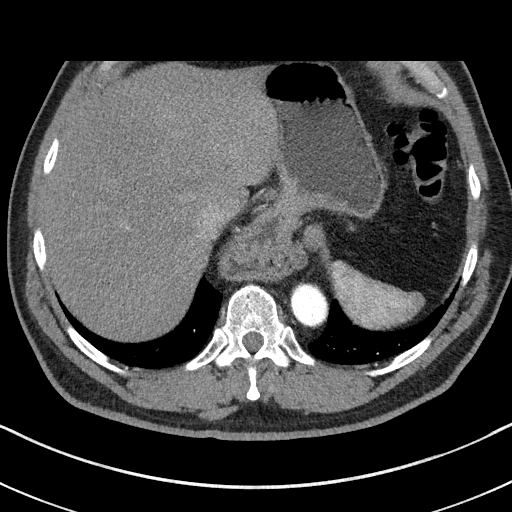
[im 71/95  lung]
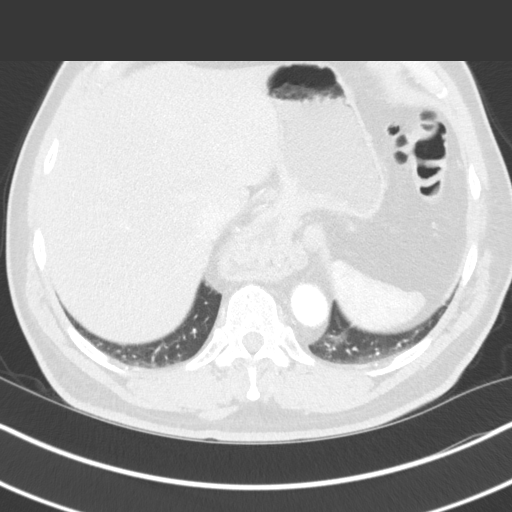
[im 83/95  soft-tissue]
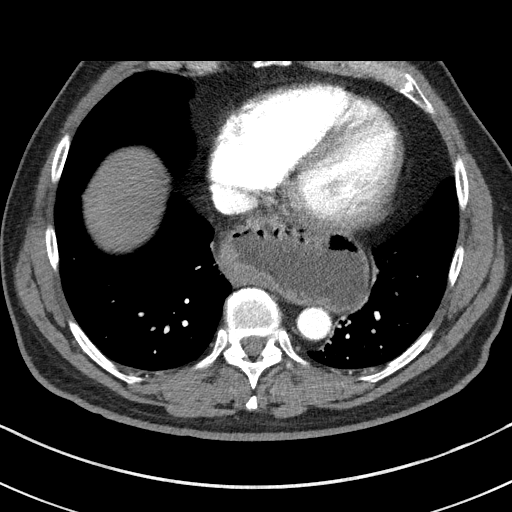
[im 83/95  lung]
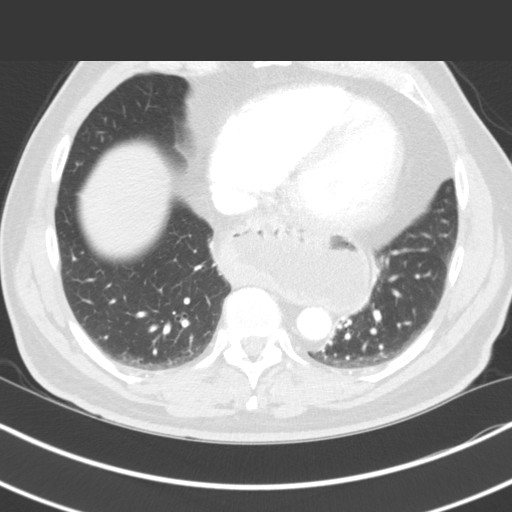

[Series 11: renal nephrographic · axial · 0.67mm/px · z∈[+338,+410]mm · 3 of 95 slices shown]
[im 12/95  soft-tissue]
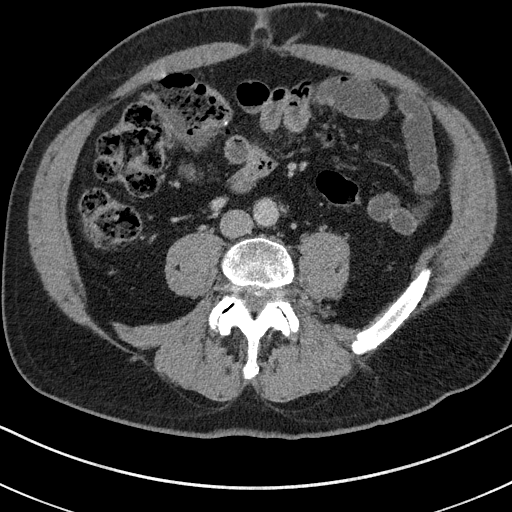
[im 24/95  soft-tissue]
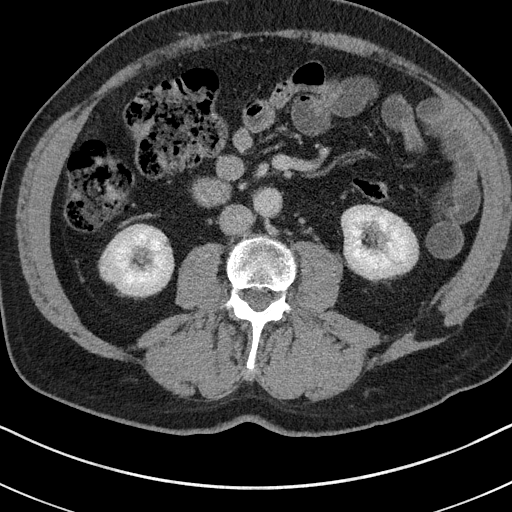
[im 36/95  soft-tissue]
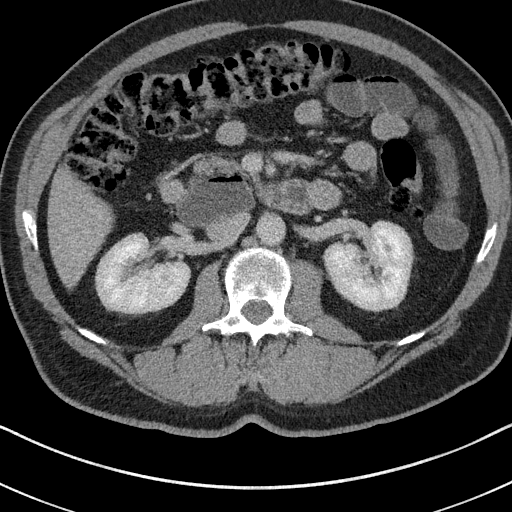

[12 of 46 positions shown; findings below may reference images not displayed]

FINDINGS: Lower chest: No acute findings.

Hepatobiliary: Mild diffuse hepatic steatosis. Stable small hepatic
cysts. 7 mm hypervascular nodule in the anterior right hepatic lobe
on image [DATE] remains stable, consistent with benign etiology such
as a flash-filling hemangioma. No new or enlarging liver lesions are
identified. Gallbladder is unremarkable.

Pancreas:  No mass or inflammatory changes.

Spleen:  Within normal limits in size and appearance.

Adrenals/Urinary Tract: Postop changes from partial left
nephrectomy. No evidence of recurrent mass. Stable sub-cm high
attenuation lesion in the midpole of the right kidney, consistent
with a tiny hemorrhagic cyst. A few other tiny right renal cysts are
seen measuring up to 1 cm. No evidence of renal calculi or
hydronephrosis.

Stomach/Bowel: Large hiatal hernia again seen. 5.5 cm duodenal
diverticulum again noted. Colonic diverticulosis again noted.

Vascular/Lymphatic: No pathologically enlarged lymph nodes
identified. No abdominal aortic aneurysm.

Other:  None.

Musculoskeletal: No suspicious bone lesions identified. Stable
benign hemangioma in T12 vertebral body.
IMPRESSION: Postop changes from partial left nephrectomy. No evidence of
recurrent or metastatic carcinoma.

Stable tiny hemorrhagic right renal cyst.

Mild hepatic steatosis.

Large hiatal hernia and duodenal diverticulum.

Colonic diverticulosis.

## 2019-09-28 ENCOUNTER — Other Ambulatory Visit (HOSPITAL_COMMUNITY): Payer: Self-pay | Admitting: Physician Assistant

## 2019-09-28 ENCOUNTER — Other Ambulatory Visit: Payer: Self-pay | Admitting: Physician Assistant

## 2019-09-28 DIAGNOSIS — K1121 Acute sialoadenitis: Secondary | ICD-10-CM

## 2019-10-07 ENCOUNTER — Ambulatory Visit
Admission: RE | Admit: 2019-10-07 | Discharge: 2019-10-07 | Disposition: A | Discharge status: Home / Self Care | Payer: BC Managed Care – PPO | Source: Ambulatory Visit | Attending: Physician Assistant | Admitting: Physician Assistant

## 2019-10-07 ENCOUNTER — Other Ambulatory Visit: Payer: Self-pay

## 2019-10-07 DIAGNOSIS — K1121 Acute sialoadenitis: Secondary | ICD-10-CM | POA: Diagnosis present

## 2019-10-07 LAB — POCT I-STAT CREATININE: Creatinine, Ser: 0.7 mg/dL (ref 0.61–1.24)

## 2019-10-07 MED ORDER — IOHEXOL 300 MG/ML  SOLN
75.0000 mL | Freq: Once | INTRAMUSCULAR | Status: AC | PRN
  Administered 2019-10-07: 80 mL via INTRAVENOUS

## 2020-09-14 IMAGING — US US RENAL
1 series · 13 of 25 positions shown · non-contrast
Comparison: Abdominal MRI May 02, 2016; CT abdomen November 07, 2016 and December 09, 2017

CLINICAL DATA: History of partial left nephrectomy; history of
renal cell carcinoma on the left

EXAM:
RENAL / URINARY TRACT ULTRASOUND COMPLETE

[Series 1: us renal · 0.23mm/px · 43 acquisitions, 13 frames shown]
[im 1/43]
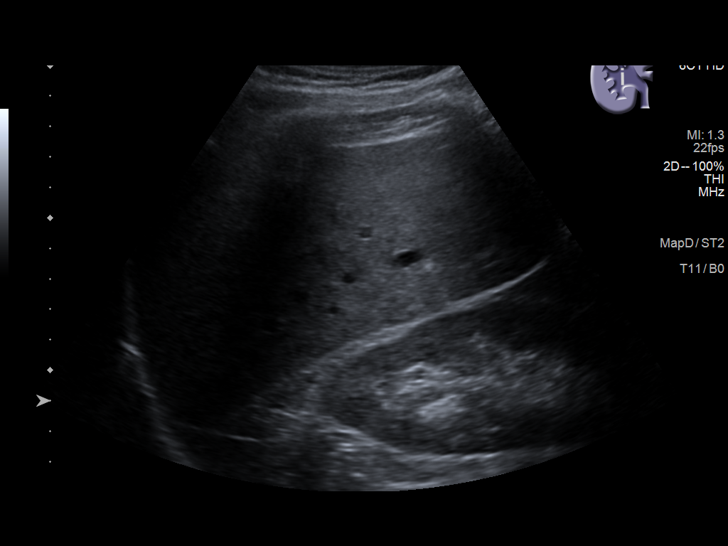
[im 4/43]
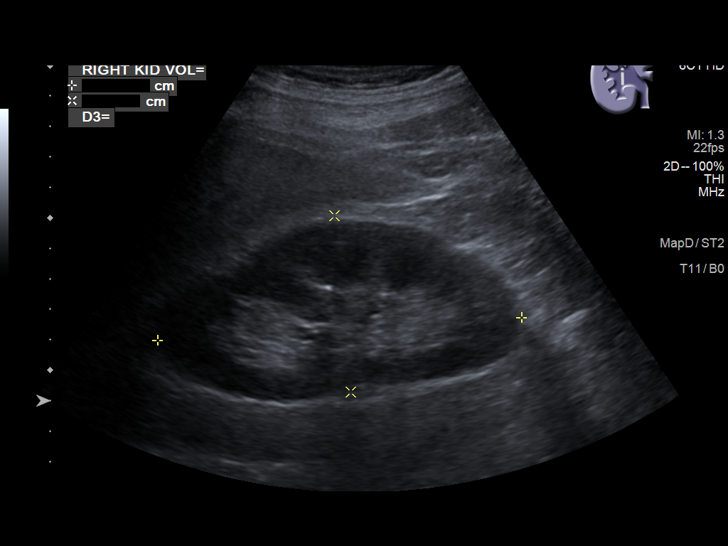
[im 8/43]
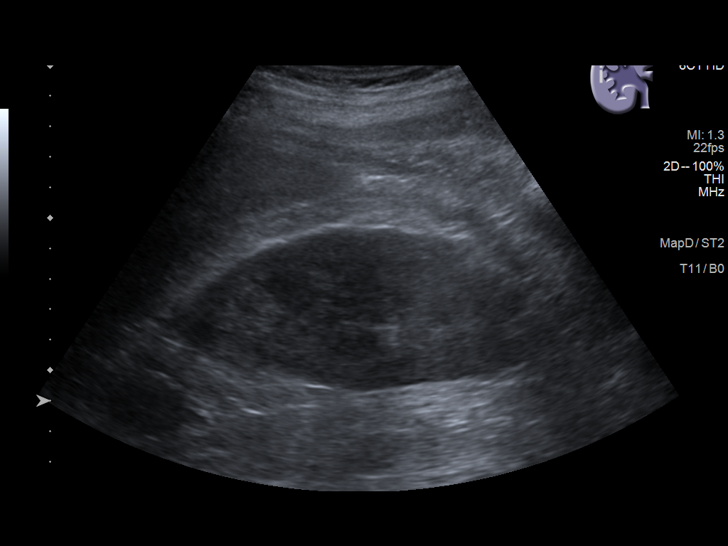
[im 11/43]
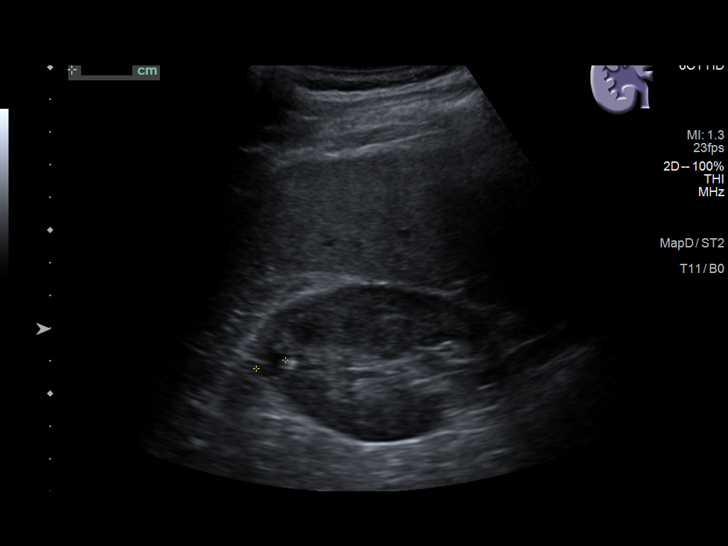
[im 15/43]
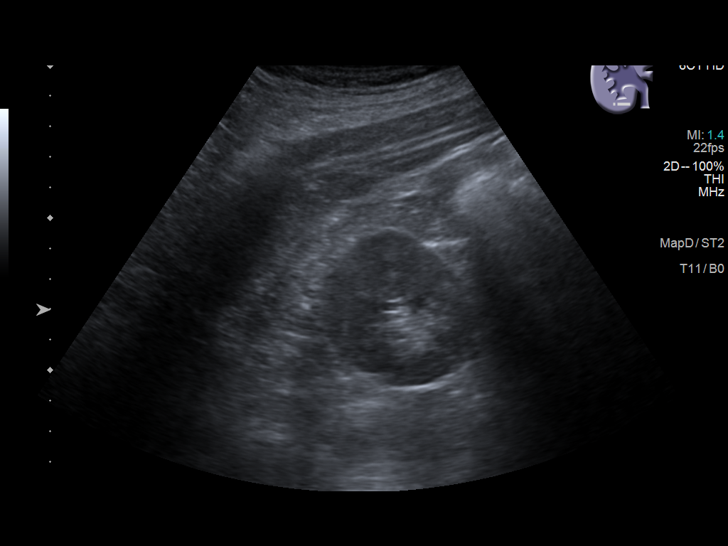
[im 18/43]
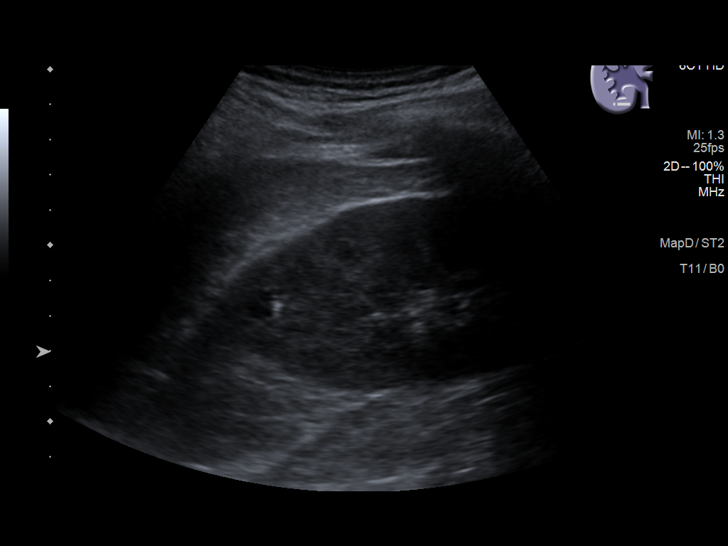
[im 22/43]
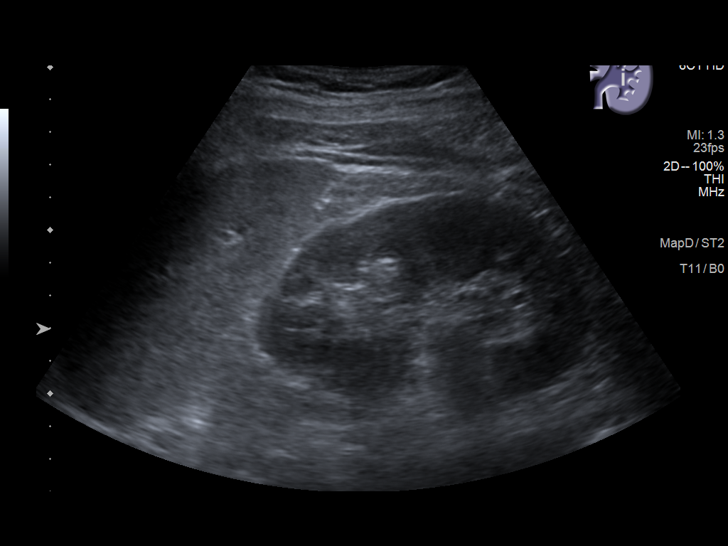
[im 25/43]
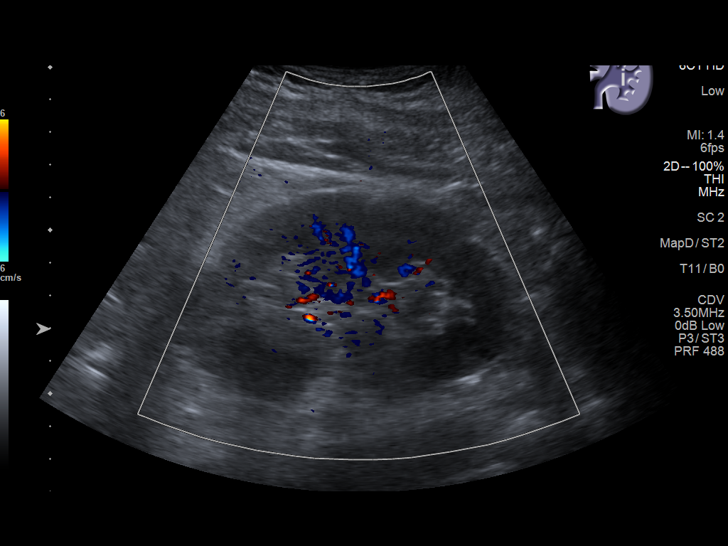
[im 29/43]
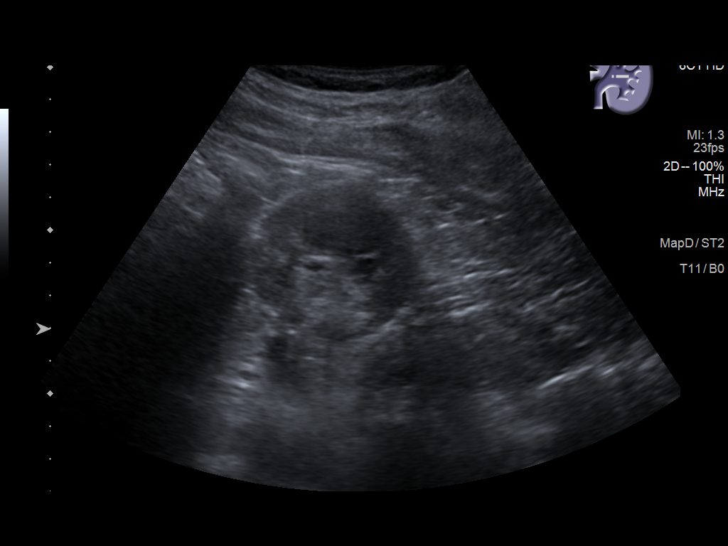
[im 32/43]
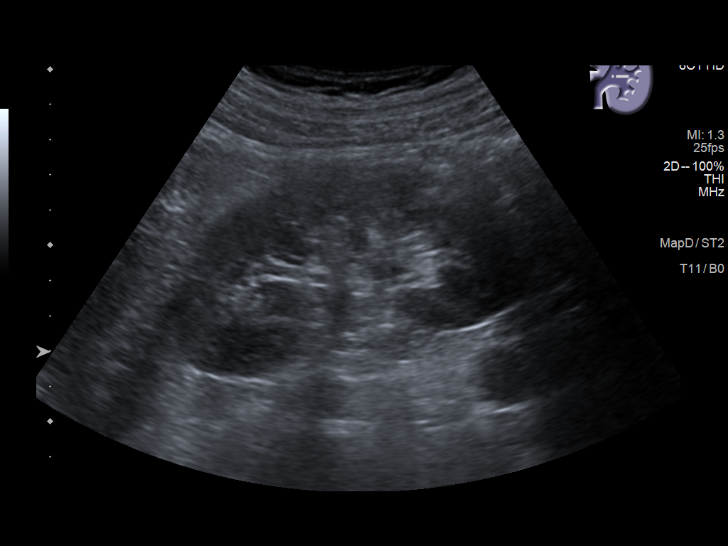
[im 36/43]
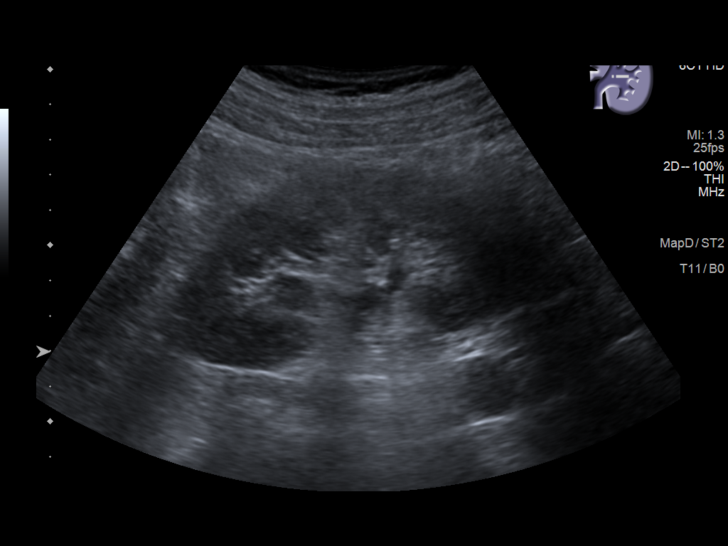
[im 39/43]
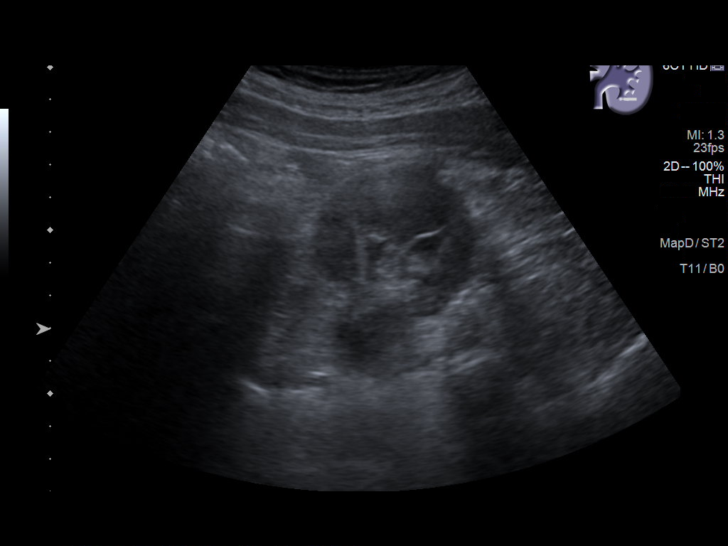
[im 43/43]
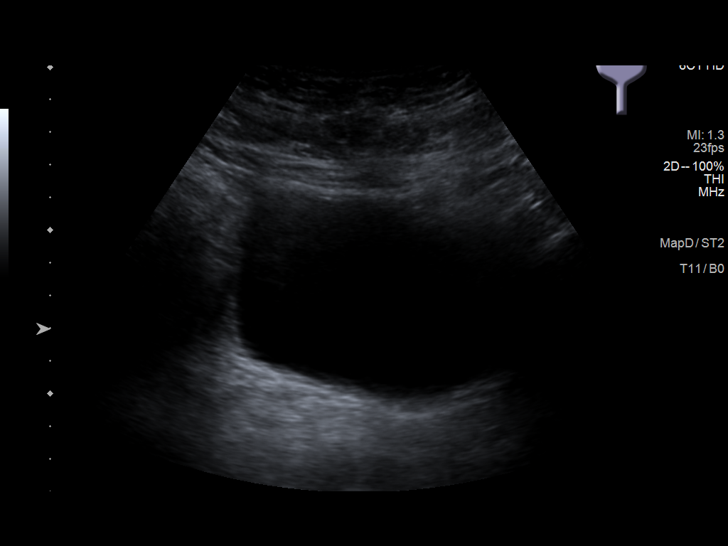

[13 of 25 positions shown; findings below may reference images not displayed]

FINDINGS: Right Kidney:

Renal measurements: 12.0 x 5.8 x 5.4 cm = volume: 196.6 mL .
Echogenicity and renal cortical thickness are within normal limits.
No perinephric fluid or hydronephrosis visualized. There is a cyst
arising from the upper pole the right kidney measuring 0.9 x 0.9 x
0.7 cm. No sonographically demonstrable calculus or ureterectasis.

Left Kidney:

Renal measurements: 9.8 x 5.8 x 4.6 cm = volume: 137.6 mL.
Echogenicity and renal cortical thickness within normal limits. No
perinephric fluid or hydronephrosis visualized. Mild prominence in
the midportion of the kidney is essentially isoechoic with remainder
of the kidney and is felt to represent localized renal parenchyma in
this area. A well-defined mass is not seen. There is a degree of
postoperative change somewhat medially. No sonographically
demonstrable calculus or ureterectasis.

Bladder:

Appears normal for degree of bladder distention.
IMPRESSION: Evidence of postoperative change on the left. Smiled soft tissue
prominence in the mid left kidney is isoechoic with remainder kidney
and likely represents localized tissue hypertrophy which potentially
may be reactive secondary to previous surgery. A well-defined mass
is not seen in this area. This appearance on the left may warrant a
follow-up renal ultrasound in 6 months to confirm stability given
history of previous carcinoma on the left side.

Study otherwise unremarkable.

## 2020-09-14 IMAGING — CT CT CHEST W/ CM
1 series · 1 of 1 positions shown · IV contrast (omnipaque)
Comparison: Chest radiograph and CT scan of December 09, 2017.

CLINICAL DATA: History of renal cancer.  Possible lung abnormality.

EXAM:
CT CHEST WITH CONTRAST
TECHNIQUE: Multidetector CT imaging of the chest was performed during
intravenous contrast administration.
CONTRAST:  75mL OMNIPAQUE IOHEXOL 300 MG/ML  SOLN

[Series 1: topogram · coronal · 1.00mm/px · 1 of 1 slices shown]
[im 1/1]
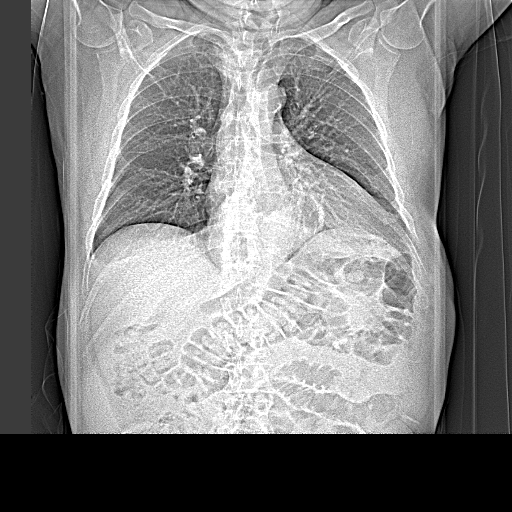

[1 of 1 positions shown; findings below may reference images not displayed]

FINDINGS: Cardiovascular: Atherosclerosis of thoracic aorta is noted without
aneurysm or dissection. Normal cardiac size. No pericardial
effusion.

Mediastinum/Nodes: Large sliding-type hiatal hernia is noted. No
adenopathy is noted. Thyroid gland is unremarkable.

Lungs/Pleura: Lungs are clear. No pleural effusion or pneumothorax.

Upper Abdomen: No acute abnormality.

Musculoskeletal: No chest wall abnormality. No acute or significant
osseous findings.
IMPRESSION: Large sliding-type hiatal hernia.

No significant pulmonary abnormality is noted.

Aortic Atherosclerosis ([id number]-2D7.7).

## 2020-12-14 ENCOUNTER — Other Ambulatory Visit: Payer: Self-pay | Admitting: Physician Assistant

## 2020-12-14 ENCOUNTER — Other Ambulatory Visit (HOSPITAL_COMMUNITY): Payer: Self-pay | Admitting: Physician Assistant

## 2020-12-14 DIAGNOSIS — R413 Other amnesia: Secondary | ICD-10-CM

## 2021-02-16 ENCOUNTER — Ambulatory Visit
Admission: RE | Admit: 2021-02-16 | Discharge: 2021-02-16 | Disposition: A | Discharge status: Home / Self Care | Payer: 59 | Source: Ambulatory Visit | Attending: Physician Assistant | Admitting: Physician Assistant

## 2021-02-16 ENCOUNTER — Other Ambulatory Visit: Payer: Self-pay

## 2021-02-16 DIAGNOSIS — R413 Other amnesia: Secondary | ICD-10-CM | POA: Diagnosis not present

## 2024-03-09 LAB — COLOGUARD: COLOGUARD: POSITIVE — AB

## 2024-04-28 ENCOUNTER — Ambulatory Visit: Payer: Self-pay

## 2024-04-28 DIAGNOSIS — R195 Other fecal abnormalities: Secondary | ICD-10-CM | POA: Diagnosis not present

## 2024-04-28 DIAGNOSIS — K5730 Diverticulosis of large intestine without perforation or abscess without bleeding: Secondary | ICD-10-CM | POA: Diagnosis not present

## 2024-04-28 DIAGNOSIS — K640 First degree hemorrhoids: Secondary | ICD-10-CM | POA: Diagnosis not present

## 2024-04-28 DIAGNOSIS — Z1211 Encounter for screening for malignant neoplasm of colon: Secondary | ICD-10-CM | POA: Diagnosis present
